# Patient Record
Sex: Female | Born: 1985 | Race: Black or African American | Hispanic: No | Marital: Single | Smoking: Former smoker
Health system: Southern US, Community
[De-identification: ages and names within clinical notes are randomized; demographics above are authoritative.]

## PROBLEM LIST (undated history)

## (undated) ENCOUNTER — Inpatient Hospital Stay (HOSPITAL_COMMUNITY): Payer: Self-pay

## (undated) DIAGNOSIS — I10 Essential (primary) hypertension: Secondary | ICD-10-CM

## (undated) DIAGNOSIS — F329 Major depressive disorder, single episode, unspecified: Secondary | ICD-10-CM

## (undated) DIAGNOSIS — G43909 Migraine, unspecified, not intractable, without status migrainosus: Secondary | ICD-10-CM

## (undated) DIAGNOSIS — D649 Anemia, unspecified: Secondary | ICD-10-CM

## (undated) DIAGNOSIS — E669 Obesity, unspecified: Secondary | ICD-10-CM

## (undated) DIAGNOSIS — R87619 Unspecified abnormal cytological findings in specimens from cervix uteri: Secondary | ICD-10-CM

## (undated) DIAGNOSIS — F32A Depression, unspecified: Secondary | ICD-10-CM

## (undated) DIAGNOSIS — B999 Unspecified infectious disease: Secondary | ICD-10-CM

## (undated) DIAGNOSIS — IMO0002 Reserved for concepts with insufficient information to code with codable children: Secondary | ICD-10-CM

## (undated) DIAGNOSIS — D219 Benign neoplasm of connective and other soft tissue, unspecified: Secondary | ICD-10-CM

## (undated) HISTORY — DX: Reserved for concepts with insufficient information to code with codable children: IMO0002

## (undated) HISTORY — DX: Unspecified infectious disease: B99.9

## (undated) HISTORY — DX: Anemia, unspecified: D64.9

## (undated) HISTORY — PX: BREAST SURGERY: SHX581

## (undated) HISTORY — DX: Unspecified abnormal cytological findings in specimens from cervix uteri: R87.619

---

## 2010-01-19 ENCOUNTER — Emergency Department (HOSPITAL_COMMUNITY): Admission: EM | Admit: 2010-01-19 | Discharge: 2010-01-19 | Payer: Self-pay | Admitting: Emergency Medicine

## 2012-07-17 ENCOUNTER — Emergency Department (HOSPITAL_COMMUNITY): Payer: Self-pay

## 2012-07-17 ENCOUNTER — Encounter (HOSPITAL_COMMUNITY): Payer: Self-pay | Admitting: *Deleted

## 2012-07-17 ENCOUNTER — Emergency Department (HOSPITAL_COMMUNITY)
Admission: EM | Admit: 2012-07-17 | Discharge: 2012-07-17 | Disposition: A | Discharge status: Home / Self Care | Payer: Self-pay | Attending: Emergency Medicine | Admitting: Emergency Medicine

## 2012-07-17 DIAGNOSIS — F17200 Nicotine dependence, unspecified, uncomplicated: Secondary | ICD-10-CM | POA: Insufficient documentation

## 2012-07-17 DIAGNOSIS — G43909 Migraine, unspecified, not intractable, without status migrainosus: Secondary | ICD-10-CM | POA: Insufficient documentation

## 2012-07-17 HISTORY — DX: Migraine, unspecified, not intractable, without status migrainosus: G43.909

## 2012-07-17 MED ORDER — KETOROLAC TROMETHAMINE 60 MG/2ML IM SOLN
60.0000 mg | Freq: Once | INTRAMUSCULAR | Status: AC
  Administered 2012-07-17: 60 mg via INTRAMUSCULAR
  Filled 2012-07-17: qty 2

## 2012-07-17 MED ORDER — SUMATRIPTAN SUCCINATE 25 MG PO TABS: 25.0000 mg | ORAL_TABLET | ORAL | Status: DC | PRN

## 2012-07-17 MED ORDER — TRAMADOL HCL 50 MG PO TABS: 50.0000 mg | ORAL_TABLET | Freq: Four times a day (QID) | ORAL | Status: AC | PRN

## 2012-07-17 MED ORDER — DEXAMETHASONE SODIUM PHOSPHATE 10 MG/ML IJ SOLN
10.0000 mg | Freq: Once | INTRAMUSCULAR | Status: AC
  Administered 2012-07-17: 10 mg via INTRAMUSCULAR
  Filled 2012-07-17: qty 1

## 2012-07-17 MED ORDER — METHOCARBAMOL 500 MG PO TABS
500.0000 mg | ORAL_TABLET | Freq: Once | ORAL | Status: AC
  Administered 2012-07-17: 500 mg via ORAL
  Filled 2012-07-17: qty 1

## 2012-07-17 NOTE — ED Provider Notes (Signed)
History     CSN: [id number]  Arrival date & time 07/17/12  1057   First MD Initiated Contact with Patient 07/17/12 1158      12:43 PM HPI Pt reports h/o migraine for 2 years. Reports they are becoming more severe. States migraines are located behind her right eye. Describes them as throbbing. Reports she became concerned because the migraine was so severe it seemed to be causing blurry vision. However denies actual eye pain. Pt states she has never had eval for migraines. No improvement with OTC meds  Patient is a 26 y.o. female presenting with migraine. The history is provided by the patient.  Migraine This is a chronic problem. The current episode started 1 to 4 weeks ago. The problem occurs constantly. The problem has been unchanged. Associated symptoms include headaches, nausea and a visual change. Pertinent negatives include no chest pain, chills, congestion, coughing, fatigue, fever, neck pain, numbness, sore throat, vertigo, vomiting or weakness. Exacerbated by: bright light. She has tried nothing for the symptoms. The treatment provided no relief.    Past Medical History  Diagnosis Date  . Migraine     History reviewed. No pertinent past surgical history.  No family history on file.  History  Substance Use Topics  . Smoking status: Current Everyday Smoker  . Smokeless tobacco: Not on file  . Alcohol Use: Yes     occasionally    OB History    Grav Para Term Preterm Abortions TAB SAB Ect Mult Living                  Review of Systems  Constitutional: Negative for fever, chills and fatigue.  HENT: Negative for congestion, sore throat, rhinorrhea, trouble swallowing, neck pain, neck stiffness, postnasal drip and sinus pressure.   Respiratory: Negative for cough and shortness of breath.   Cardiovascular: Negative for chest pain and palpitations.  Gastrointestinal: Positive for nausea. Negative for vomiting.  Musculoskeletal: Negative for back pain.  Neurological:  Positive for headaches. Negative for dizziness, vertigo, seizures, speech difficulty, weakness, light-headedness and numbness.  All other systems reviewed and are negative.    Allergies  Review of patient's allergies indicates no known allergies.  Home Medications  No current outpatient prescriptions on file.  BP 144/98  Pulse 60  Resp 14  SpO2 96%  LMP 07/04/2012  Physical Exam  Vitals reviewed. Constitutional: She is oriented to person, place, and time. Vital signs are normal. She appears well-developed and well-nourished. No distress.  HENT:  Head: Normocephalic and atraumatic.  Mouth/Throat: Oropharynx is clear and moist. No oropharyngeal exudate.  Eyes: Conjunctivae and EOM are normal. Pupils are equal, round, and reactive to light. Right eye exhibits no discharge. Left eye exhibits no discharge.  Neck: Neck supple. No spinous process tenderness and no muscular tenderness present. No rigidity. Normal range of motion present.  Pulmonary/Chest: Effort normal.  Lymphadenopathy:    She has no cervical adenopathy.  Neurological: She is alert and oriented to person, place, and time. No cranial nerve deficit ( tested CN III-XII). She exhibits normal muscle tone (nml handgrip strength). Coordination (no nystagmus, pastpointing or pronator drift) normal.  Skin: Skin is warm and dry. No rash noted. No erythema. No pallor.  Psychiatric: She has a normal mood and affect. Her behavior is normal.    ED Course  Procedures   No results found for this or any previous visit. Ct Head Wo Contrast  07/17/2012  *RADIOLOGY REPORT*  Clinical Data:  Migraine headaches for  1 month.  CT HEAD WITHOUT CONTRAST  Technique:  Contiguous axial images were obtained from the base of the skull through the vertex without contrast  Comparison:  None.  Findings:  The brain has a normal appearance without evidence for hemorrhage, acute infarction, hydrocephalus, or mass lesion.  There is no extra axial fluid  collection.  The skull and paranasal sinuses are normal.  IMPRESSION: Normal CT of the head without contrast.  Original Report Authenticated By: Elsie Stain, M.D.     MDM   CT negative. Provided with headache [facility] contact info Will d/c with ultram and sumatriptan     Thomasene Lot, PA-C 07/17/12 1333

## 2012-07-17 NOTE — ED Provider Notes (Signed)
Medical screening examination/treatment/procedure(s) were performed by non-physician practitioner and as supervising physician I was immediately available for consultation/collaboration. Devoria Albe, MD, FACEP   Ward Givens, MD 07/17/12 [phone]

## 2012-07-17 NOTE — ED Notes (Signed)
Pt reports migraine x43month. R sided. Hx of same. Light and sound sensitivity. Has tried OTC medications and percocet without relief. +nausea, denies vomiting.

## 2012-07-18 ENCOUNTER — Emergency Department (HOSPITAL_COMMUNITY)
Admission: EM | Admit: 2012-07-18 | Discharge: 2012-07-18 | Disposition: A | Discharge status: Home / Self Care | Payer: Self-pay | Attending: Emergency Medicine | Admitting: Emergency Medicine

## 2012-07-18 ENCOUNTER — Encounter (HOSPITAL_COMMUNITY): Payer: Self-pay | Admitting: Emergency Medicine

## 2012-07-18 DIAGNOSIS — R51 Headache: Secondary | ICD-10-CM

## 2012-07-18 DIAGNOSIS — G43909 Migraine, unspecified, not intractable, without status migrainosus: Secondary | ICD-10-CM | POA: Insufficient documentation

## 2012-07-18 DIAGNOSIS — F17200 Nicotine dependence, unspecified, uncomplicated: Secondary | ICD-10-CM | POA: Insufficient documentation

## 2012-07-18 MED ORDER — SUMATRIPTAN SUCCINATE 25 MG PO TABS: 25.0000 mg | ORAL_TABLET | ORAL | Status: DC | PRN

## 2012-07-18 MED ORDER — KETOROLAC TROMETHAMINE 30 MG/ML IJ SOLN
30.0000 mg | Freq: Once | INTRAMUSCULAR | Status: AC
  Administered 2012-07-18: 30 mg via INTRAMUSCULAR
  Filled 2012-07-18: qty 1

## 2012-07-18 NOTE — ED Notes (Signed)
Pt states she was seen for migraine headache yesterday and was prescribed Imitrex and Tramadol. Pt states she is unable to fill Rx's because she is paying out of pocket. Pt states she is still in pain. Pt denies nausea, but c/o light sensitivity.

## 2012-07-18 NOTE — ED Provider Notes (Signed)
History     CSN: [id number]  Arrival date & time 07/18/12  1611   First MD Initiated Contact with Patient 07/18/12 1636      No chief complaint on file.   (Consider location/radiation/quality/duration/timing/severity/associated sxs/prior treatment) Patient is a 26 y.o. female presenting with migraine. The history is provided by the patient.  Migraine This is a chronic problem. Episode onset: Headaches for the last 2 years. Current episode for the last few weeks. The problem occurs constantly. Progression since onset: Improved yesterday after treatment in the emergency department, returned today. Associated symptoms include headaches. Pertinent negatives include no congestion, joint swelling, numbness, rash, sore throat, vomiting or weakness. Associated symptoms comments: Positive photophobia. Negative fever, chills, neck stiffness. Negative visual change. Nausea in the past but none today.. Exacerbated by: Bright lights.   no treatment tried at home. Treated in the emergency department yesterday with Decadron, Robaxin, Toradol with good temporary relief of symptoms. Returns to the year today as a prescription she was given her point cost several $100 at both of the outpatient pharmacies she checked with.  Past Medical History  Diagnosis Date  . Migraine     History reviewed. No pertinent past surgical history.  No family history on file.  History  Substance Use Topics  . Smoking status: Current Everyday Smoker  . Smokeless tobacco: Not on file  . Alcohol Use: Yes     occasionally     Review of Systems  HENT: Negative for congestion and sore throat.   Gastrointestinal: Negative for vomiting.  Musculoskeletal: Negative for joint swelling.  Skin: Negative for rash.  Neurological: Positive for headaches. Negative for weakness and numbness.  10 systems reviewed and are negative for acute change except as noted in the HPI.]   Allergies  Review of patient's allergies indicates  no known allergies.  Home Medications   Current Outpatient Rx  Name Route Sig Dispense Refill  . SUMATRIPTAN SUCCINATE 25 MG PO TABS Oral Take 1 tablet (25 mg total) by mouth every 2 (two) hours as needed for migraine. 20 tablet 0  . TRAMADOL HCL 50 MG PO TABS Oral Take 1 tablet (50 mg total) by mouth every 6 (six) hours as needed for pain. 30 tablet 0    BP 133/97  Pulse 66  Temp 99.1 F (37.3 C) (Oral)  SpO2 99%  LMP 07/04/2012  Physical Exam  Nursing note and vitals reviewed.  patient is well-developed, well-nourished, mildly uncomfortable appearing. Head is normocephalic and atraumatic. External ears are normal. Oral mucosa moist. Pupils are equal, round, reactive to light. Conjunctiva are normal. Extraocular movements are intact without nystagmus. Neck is supple without adenopathy. Heart regular rate and rhythm. Normal respiratory effort and excursion. Extremities without edema. She is alert and oriented x3. Cranial nerves III through XII are tested and are intact. Bilateral grip strength 5 out of 5. Finger to nose intact bilaterally. No pronator drift. Gait is normal. Romberg negative. Skin is warm and dry without any obvious rash or lesion. Mood and affect are normal.  ED Course  Procedures (including critical care time)  Labs Reviewed - No data to display Ct Head Wo Contrast  07/17/2012  *RADIOLOGY REPORT*  Clinical Data:  Migraine headaches for 1 month.  CT HEAD WITHOUT CONTRAST  Technique:  Contiguous axial images were obtained from the base of the skull through the vertex without contrast  Comparison:  None.  Findings:  The brain has a normal appearance without evidence for hemorrhage, acute infarction, hydrocephalus, or  mass lesion.  There is no extra axial fluid collection.  The skull and paranasal sinuses are normal.  IMPRESSION: Normal CT of the head without contrast.  Original Report Authenticated By: Elsie Stain, M.D.     Dx 1: Headache   MDM  Migraine  headache in a patient with a known history of same. She was seen in the emergency department yesterday and had a CT scan which was normal. She does not have any signs of meningitis such as fever, neck stiffness, rash. There's been no head injury since her prior evaluation. Not sudden onset or worse headache of life to suggest subarachnoid hemorrhage the may not have been seen on CT scan. I called the [place] outpatient pharmacy and they advised that Imitrex will cost $90 when written as a prescription to the emergency department. As the patient turned in her initial prescription to [facility], our write her a new one. She is given a repeat dose of Toradol (half the prior dose to avoid excess strain on the kidneys) and will be discharged home.        Shaaron Adler, [place] 07/18/12 1704

## 2012-07-18 NOTE — ED Provider Notes (Signed)
Medical screening examination/treatment/procedure(s) were performed by non-physician practitioner and as supervising physician I was immediately available for consultation/collaboration.  Mariadelcarmen Corella, MD 07/18/12 1707 

## 2012-11-08 ENCOUNTER — Ambulatory Visit (INDEPENDENT_AMBULATORY_CARE_PROVIDER_SITE_OTHER): Payer: BC Managed Care – PPO | Admitting: Obstetrics and Gynecology

## 2012-11-08 VITALS — BP 130/80

## 2012-11-08 DIAGNOSIS — Z331 Pregnant state, incidental: Secondary | ICD-10-CM

## 2012-11-08 LAB — POCT URINALYSIS DIPSTICK
Bilirubin, UA: NEGATIVE
Glucose, UA: NEGATIVE
Ketones, UA: NEGATIVE
Protein, UA: NEGATIVE

## 2012-11-08 MED ORDER — PROMETHAZINE HCL 25 MG PO TABS: 25.0000 mg | ORAL_TABLET | Freq: Four times a day (QID) | ORAL | Status: DC | PRN

## 2012-11-08 NOTE — Progress Notes (Signed)
NOB interview completed.  Pt came in c/o severe headaches for a while now.  Has been going to bed with a headache and waking up with a headache.  Was Rx'd Fiorcet by [facility] @ [place] [employer], pt says it is not helping, is taking as Rx'd.  Was seen @ [facility] in 06/2012 for migraines (pt has a hx), had a normal CT scan.  Per VL, get a BP on pt, was 130/80.  Pt advised to continue meds, push fluids, rest, also made an appt w/ AVS for Monday 11/13/12 @ 1115 for eval.  Pt advised to call anytime she has any concerns.  Pt scheduled for an appt for NOB work up on 12/11/12 @ 1100 w/ SL.

## 2012-11-09 LAB — PRENATAL PANEL VII
Antibody Screen: NEGATIVE
Basophils Relative: 0 % (ref 0–1)
Eosinophils Absolute: 0.1 10*3/uL (ref 0.0–0.7)
Eosinophils Relative: 2 % (ref 0–5)
Hemoglobin: 12.7 g/dL (ref 12.0–15.0)
Lymphs Abs: 2 10*3/uL (ref 0.7–4.0)
MCH: 32.2 pg (ref 26.0–34.0)
MCHC: 34 g/dL (ref 30.0–36.0)
MCV: 94.9 fL (ref 78.0–100.0)
Monocytes Relative: 8 % (ref 3–12)
RBC: 3.94 MIL/uL (ref 3.87–5.11)
Rh Type: POSITIVE
Rubella: 217.8 IU/mL — ABNORMAL HIGH

## 2012-11-10 LAB — HEMOGLOBINOPATHY EVALUATION: Hgb A: 97.4 % (ref 96.8–97.8)

## 2012-11-13 ENCOUNTER — Encounter: Payer: Self-pay | Admitting: Obstetrics and Gynecology

## 2012-11-13 ENCOUNTER — Ambulatory Visit (INDEPENDENT_AMBULATORY_CARE_PROVIDER_SITE_OTHER): Payer: BC Managed Care – PPO | Admitting: Obstetrics and Gynecology

## 2012-11-13 VITALS — BP 110/62 | Wt 229.0 lb

## 2012-11-13 DIAGNOSIS — R51 Headache: Secondary | ICD-10-CM

## 2012-11-13 DIAGNOSIS — R110 Nausea: Secondary | ICD-10-CM

## 2012-11-13 MED ORDER — HYDROCODONE-ACETAMINOPHEN 5-500 MG PO TABS: 1.0000 | ORAL_TABLET | ORAL | Status: DC | PRN

## 2012-11-13 MED ORDER — ONDANSETRON HCL 4 MG PO TABS: 4.0000 mg | ORAL_TABLET | Freq: Three times a day (TID) | ORAL | Status: DC | PRN

## 2012-11-13 NOTE — Progress Notes (Signed)
HISTORY OF PRESENT ILLNESS  [name] is a 26 y.o. year old female,G2P1001, who presents for a problem visit. The patient is currently 6 weeks and 2 days pregnant.  She has a history of migraine headaches.  She has been treated with Fioricet.  This has been of little benefit.  Subjective:  The patient complains of nausea as well as recurrent headaches.  Objective:  BP 110/62  Wt 229 lb (103.874 kg)  LMP 09/30/2012   General: mild distress  HEENT: Light and sound seem to be bothersome  Exam deferred.  Assessment:  6 week and 2 day gestation  Worsening migraine headaches  Nausea of pregnancy  Plan:  We will refer the patient to a neurologist to manage her headaches.  Vicodin 1 or 2 tablets every 4 hours as needed for headache.  Zofran 4 mg every 4-6 hours as needed for nausea.  Return for new OB examination.  Leonard Schwartz M.D.  11/13/2012 7:21 PM

## 2012-11-17 ENCOUNTER — Telehealth: Payer: Self-pay

## 2012-11-17 NOTE — Telephone Encounter (Signed)
Per AVS pt needs referral to see Neurologist to manage headaches. Phone call to [facility] regarding making pt an appt  They asked that a referral be faxed and they will then contact the patient Office note was printed and faxed to Attention Diane the New Patient Coordinator. Pt was called to be made aware but she has no voice mail set up.   [facility] CMA

## 2012-11-29 ENCOUNTER — Encounter: Payer: Self-pay | Admitting: Obstetrics and Gynecology

## 2012-12-04 ENCOUNTER — Telehealth: Payer: Self-pay | Admitting: Obstetrics and Gynecology

## 2012-12-04 NOTE — Telephone Encounter (Signed)
Tc to pt regarding msg.  Pt says she has bumps all over her body and they are itching badly.  Pt is 9w 2d, did not know what to do.  Pt advised to try taking Benadryl to see if it will help relieve the itching and clear up the bumps.  If not to go to an urgent care to see if they can help, if they feel she needs to be seen by our office, pt advised to call office for an appt, pt voices agreement.

## 2012-12-05 ENCOUNTER — Encounter (HOSPITAL_COMMUNITY): Payer: Self-pay | Admitting: Obstetrics and Gynecology

## 2012-12-05 ENCOUNTER — Emergency Department (HOSPITAL_COMMUNITY)
Admission: EM | Admit: 2012-12-05 | Discharge: 2012-12-05 | Disposition: A | Discharge status: Home / Self Care | Payer: Self-pay

## 2012-12-05 ENCOUNTER — Inpatient Hospital Stay (HOSPITAL_COMMUNITY)
Admission: AD | Admit: 2012-12-05 | Discharge: 2012-12-05 | Disposition: A | Discharge status: Home / Self Care | Payer: Medicaid Other | Source: Ambulatory Visit | Attending: Obstetrics and Gynecology | Admitting: Obstetrics and Gynecology

## 2012-12-05 DIAGNOSIS — R5381 Other malaise: Secondary | ICD-10-CM | POA: Insufficient documentation

## 2012-12-05 DIAGNOSIS — O210 Mild hyperemesis gravidarum: Secondary | ICD-10-CM | POA: Insufficient documentation

## 2012-12-05 DIAGNOSIS — Z0289 Encounter for other administrative examinations: Secondary | ICD-10-CM | POA: Insufficient documentation

## 2012-12-05 DIAGNOSIS — G43909 Migraine, unspecified, not intractable, without status migrainosus: Secondary | ICD-10-CM | POA: Insufficient documentation

## 2012-12-05 DIAGNOSIS — O99891 Other specified diseases and conditions complicating pregnancy: Secondary | ICD-10-CM | POA: Insufficient documentation

## 2012-12-05 DIAGNOSIS — Z331 Pregnant state, incidental: Secondary | ICD-10-CM

## 2012-12-05 DIAGNOSIS — R51 Headache: Secondary | ICD-10-CM

## 2012-12-05 LAB — URINALYSIS, ROUTINE W REFLEX MICROSCOPIC
Ketones, ur: NEGATIVE mg/dL
Leukocytes, UA: NEGATIVE
Nitrite: NEGATIVE
pH: 7 (ref 5.0–8.0)

## 2012-12-05 MED ORDER — DEXAMETHASONE SODIUM PHOSPHATE 10 MG/ML IJ SOLN
10.0000 mg | Freq: Once | INTRAMUSCULAR | Status: AC
  Administered 2012-12-05: 10 mg via INTRAVENOUS
  Filled 2012-12-05: qty 1

## 2012-12-05 MED ORDER — BUTALBITAL-APAP-CAFFEINE 50-325-40 MG PO TABS: 1.0000 | ORAL_TABLET | ORAL | Status: DC | PRN

## 2012-12-05 MED ORDER — DIPHENHYDRAMINE HCL 50 MG/ML IJ SOLN
25.0000 mg | Freq: Once | INTRAMUSCULAR | Status: AC
  Administered 2012-12-05: 25 mg via INTRAVENOUS
  Filled 2012-12-05: qty 1

## 2012-12-05 MED ORDER — PROMETHAZINE HCL 25 MG/ML IJ SOLN
12.5000 mg | Freq: Once | INTRAMUSCULAR | Status: AC
  Administered 2012-12-05: 12.5 mg via INTRAVENOUS
  Filled 2012-12-05: qty 1

## 2012-12-05 MED ORDER — LACTATED RINGERS IV BOLUS (SEPSIS)
500.0000 mL | Freq: Once | INTRAVENOUS | Status: AC
  Administered 2012-12-05: 500 mL via INTRAVENOUS

## 2012-12-05 MED ORDER — SUMATRIPTAN SUCCINATE 25 MG PO TABS: 25.0000 mg | ORAL_TABLET | ORAL | Status: DC | PRN

## 2012-12-05 MED ORDER — PROCHLORPERAZINE EDISYLATE 5 MG/ML IJ SOLN
10.0000 mg | Freq: Once | INTRAMUSCULAR | Status: AC
  Administered 2012-12-05: 10 mg via INTRAVENOUS
  Filled 2012-12-05: qty 2

## 2012-12-05 MED ORDER — PROMETHAZINE HCL 25 MG PO TABS: 25.0000 mg | ORAL_TABLET | Freq: Four times a day (QID) | ORAL | Status: DC | PRN

## 2012-12-05 NOTE — MAU Note (Signed)
Pt presents with complaint of "my head is hurting, i feel like something in my head is going to bust", states she has had the headache for 6 months.

## 2012-12-05 NOTE — MAU Provider Note (Signed)
History     CSN: [id number]  Arrival date and time: 12/05/12 1922   None     Chief Complaint  Patient presents with  . Headache   HPI Comments: Pt is 26yo G2P1 at [redacted]w[redacted]d w c/o persistent HA, vomiting, generalized weakness. Has hx of migraines "ran out of medicines" Has not taken hyperemetics, that were previously prescribed. "cant drink water" Feels nauseous all day, vomiting when waking up, occ throughout day. Went to [facility] ED today, but wasn't seen by physicians, states "wait was too long"  Saw Dr Stefano Gaul last week for HA's Scheduled for NOB w/u next week.     Past Medical History  Diagnosis Date  . Migraine     Imitrex taken in the past  . Abnormal Pap smear 2008;2013    Rpt  paps have been abnl;Last papn 2012 was abnormal  . Anemia     Iron supplements taken in the past;during last pregnancy  . Infection     BV x 3;currently being treated for infection  . Infection     Yeast;not frequent    Past Surgical History  Procedure Date  . 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 Hypertension Mother   . Diabetes Maternal Grandfather   . Hypertension Maternal Grandfather   . Asthma Sister   . Migraines Mother   . Migraines Sister   . Migraines Brother   . Migraines Maternal Uncle   . Migraines Maternal Grandmother   . Other Maternal Grandmother     Varicose veins    History  Substance Use Topics  . Smoking status: Never Smoker   . Smokeless tobacco: Never Used  . Alcohol Use: Yes     Comment: occasionally prior to pregnancy    Allergies: No Known Allergies  Prescriptions prior to admission  Medication Sig Dispense Refill  . acetaminophen (TYLENOL) 500 MG tablet Take 500 mg by mouth every 6 (six) hours as needed. For headache      . butalbital-acetaminophen-caffeine (FIORICET, ESGIC) 50-325-40 MG per tablet Take 1 tablet by mouth every 4 (four) hours as needed.      . flintstones complete  (FLINTSTONES) 60 MG chewable tablet Chew 1 tablet by mouth daily.      Marland Kitchen HYDROcodone-acetaminophen (VICODIN) 5-500 MG per tablet Take 1-2 tablets by mouth every 4 (four) hours as needed for pain.  30 tablet  0  . metroNIDAZOLE (FLAGYL) 500 MG tablet Take 500 mg by mouth daily.      . Prenatal Vit-Fe Fumarate-FA (PRENATAL MULTIVITAMIN) TABS Take 1 tablet by mouth daily.      . promethazine (PHENERGAN) 25 MG tablet Take 1 tablet (25 mg total) by mouth every 6 (six) hours as needed for nausea.  36 tablet  2  . SUMAtriptan (IMITREX) 25 MG tablet Take 1 tablet (25 mg total) by mouth every 2 (two) hours as needed for migraine. Do not take more than 200mg  in 1 day.  20 tablet  0    Review of Systems  HENT: Negative for neck pain.   Eyes: Positive for photophobia.  Cardiovascular: Negative for chest pain and palpitations.  Gastrointestinal: Positive for nausea and vomiting. Negative for heartburn, abdominal pain, diarrhea and constipation.  Genitourinary: Negative for dysuria, urgency and frequency.  Neurological: Positive for weakness and headaches. Negative for loss of consciousness.  All other systems reviewed and are negative.   Physical Exam  Blood pressure 130/81, pulse 91, temperature 98.8 F (37.1 C), temperature source Oral, resp. rate 16, height 5' 6.5" (1.689 m), weight 229 lb (103.874 kg), last menstrual period 09/30/2012, SpO2 97.00%.  Physical Exam  Nursing note and vitals reviewed. Constitutional: She is oriented to person, place, and time. She appears well-developed and well-nourished.       Lights off in room, holding head, pt very distracted, A&O x3,   HENT:  Head: Normocephalic.  Eyes: Pupils are equal, round, and reactive to light.  Neck: Normal range of motion.       No stiffness  Cardiovascular: Normal rate, regular rhythm and normal heart sounds.   Respiratory: Effort normal and breath sounds normal.  GI: Soft. Bowel sounds are normal.  Genitourinary:        deferred  Musculoskeletal: Normal range of motion. She exhibits no edema and no tenderness.  Neurological: She is alert and oriented to person, place, and time. She has normal reflexes.  Skin: Skin is warm and dry.  Psychiatric: She has a normal mood and affect. Her behavior is normal.   Results for orders placed during the hospital encounter of 12/05/12 (from the past 24 hour(s))  URINALYSIS, ROUTINE W REFLEX MICROSCOPIC     Status: Normal   Collection Time   12/05/12  7:31 PM      Component Value Range   Color, Urine YELLOW  YELLOW   APPearance CLEAR  CLEAR   Specific Gravity, Urine 1.015  1.005 - 1.030   pH 7.0  5.0 - 8.0   Glucose, UA NEGATIVE  NEGATIVE mg/dL   Hgb urine dipstick NEGATIVE  NEGATIVE   Bilirubin Urine NEGATIVE  NEGATIVE   Ketones, ur NEGATIVE  NEGATIVE mg/dL   Protein, ur NEGATIVE  NEGATIVE mg/dL   Urobilinogen, UA 0.2  0.0 - 1.0 mg/dL   Nitrite NEGATIVE  NEGATIVE   Leukocytes, UA NEGATIVE  NEGATIVE     MAU Course  Procedures    Assessment and Plan  IUP at [redacted]w[redacted]d by LMP Hx persistent migraines - scheduled to see neurology 12-12, non-compliant w medications  UA normal  Normal 1st trimester symptoms  IVF's LR bolus HA cocktail - decadron 10mg , compazine 10mg , benadryl 25mg     Nashawn Hillock M 12/05/2012, 8:52 PM   Addendum: 2300  Pt reports relief of HA and nausea Ready for d/c rx for: fiorcet, imatrex, phenergan Keep scheduled appointment Hyperemesis diet and BRAT diet Counseled re: hydration, taking rx'd medicine regimen rv'd w Dr Ward Givens, CNM

## 2012-12-11 ENCOUNTER — Encounter: Payer: Self-pay | Admitting: Obstetrics and Gynecology

## 2012-12-12 ENCOUNTER — Telehealth (HOSPITAL_COMMUNITY): Payer: Self-pay | Admitting: Obstetrics and Gynecology

## 2012-12-12 NOTE — Telephone Encounter (Signed)
TC from patient--10 weeks, NOB w/u had to be R/S from yesterday due to CNM absence. Patient thinks she has BV and wants to be seen ASAP. Office called, LM to call patient this am to schedule w/i appt today for evaluation. Apologies made.

## 2012-12-13 ENCOUNTER — Encounter: Payer: Self-pay | Admitting: Obstetrics and Gynecology

## 2012-12-13 ENCOUNTER — Telehealth: Payer: Self-pay | Admitting: Obstetrics and Gynecology

## 2012-12-13 ENCOUNTER — Ambulatory Visit (INDEPENDENT_AMBULATORY_CARE_PROVIDER_SITE_OTHER): Payer: Medicaid Other | Admitting: Obstetrics and Gynecology

## 2012-12-13 VITALS — BP 100/66 | Wt 232.0 lb

## 2012-12-13 DIAGNOSIS — Z124 Encounter for screening for malignant neoplasm of cervix: Secondary | ICD-10-CM

## 2012-12-13 DIAGNOSIS — Z331 Pregnant state, incidental: Secondary | ICD-10-CM

## 2012-12-13 DIAGNOSIS — N898 Other specified noninflammatory disorders of vagina: Secondary | ICD-10-CM

## 2012-12-13 DIAGNOSIS — Z3490 Encounter for supervision of normal pregnancy, unspecified, unspecified trimester: Secondary | ICD-10-CM

## 2012-12-13 LAB — POCT WET PREP (WET MOUNT): pH: 5.5

## 2012-12-13 LAB — POCT OSOM BVBLUE TEST: Bacterial Vaginosis: POSITIVE

## 2012-12-13 MED ORDER — CONCEPT OB 130-92.4-1 MG PO CAPS: 1.0000 | ORAL_CAPSULE | Freq: Every day | ORAL | Status: DC

## 2012-12-13 MED ORDER — METRONIDAZOLE 0.75 % EX GEL: CUTANEOUS | Status: DC

## 2012-12-13 NOTE — Progress Notes (Signed)
  Subjective:    Sarah Oconnor is being seen today for her first obstetrical visit.  This is not a planned pregnancy. She is at [redacted]w[redacted]d gestation. Her obstetrical history is significant for daily headaches. Relationship with FOB: present. . Pregnancy history fully reviewed.  Patient reports daily headaches with some relief from Fioricet.  She has been evaluated with narcotics prescribed.  She has some nausea.   Review of Systems:   Review of Systems  Constitutional: Negative.   HENT: Positive for neck stiffness.   Eyes: Positive for visual disturbance.       Only  with headaches  Cardiovascular: Negative.   Gastrointestinal: Positive for nausea.  Genitourinary:        occasional crampy abdominal pain Patient did not take medication forBV  Skin: Negative.   Neurological: Positive for headaches.  Hematological: Negative.   Psychiatric/Behavioral: Negative.     Objective:     BP 100/66  Wt 232 lb (105.235 kg)  LMP 09/30/2012 Physical Exam  Constitutional: She is oriented to person, place, and time. She appears well-developed and well-nourished.  HENT:  Head: Normocephalic and atraumatic.  Mouth/Throat: Oropharynx is clear and moist.  Eyes: Conjunctivae normal and EOM are normal. Pupils are equal, round, and reactive to light.  Neck: Normal range of motion. Neck supple.  Cardiovascular: Normal rate, regular rhythm, normal heart sounds and intact distal pulses.   Respiratory: Effort normal and breath sounds normal.  GI: Soft. Bowel sounds are normal.  Genitourinary:       Wallace Cullens vaginal discharge  Musculoskeletal: Normal range of motion.  Neurological: She is alert and oriented to person, place, and time.  Skin: Skin is warm and dry.  Psychiatric: She has a normal mood and affect.    Exam   OSCOM BV: POSITIVE WET PREP:  BV Assessment:    Pregnancy: G2P1001 Patient Active Problem List  Diagnosis  . Pregnancy  Migraine headaches vs muscle tension headaches BV     Plan:     Ibuproben 6oomg q 6 h for 5 days Initial labs REVIEWED Prenatal vitamins. Problem list reviewed and updated. AFP3 discussed: undecided. Role of ultrasound in pregnancy discussed;. metrogel  Follow up in 4 weeks. 75% of 30 min visit spent on counseling and coordination of care.  Pt will continue followup with her chiropractor for headache relief, but will be referred to neurlogist if no improvement at next visit  Brilyn Tuller P 12/13/2012

## 2012-12-13 NOTE — Progress Notes (Deleted)
[redacted]w[redacted]d Pt c/o cramping for the last week and some spotting yesterday. Pt is unsure when her last pap was.

## 2012-12-14 LAB — PAP IG W/ RFLX HPV ASCU

## 2012-12-15 LAB — HUMAN PAPILLOMAVIRUS, HIGH RISK: HPV DNA High Risk: NOT DETECTED

## 2012-12-18 ENCOUNTER — Telehealth: Payer: Self-pay | Admitting: Obstetrics and Gynecology

## 2012-12-18 ENCOUNTER — Other Ambulatory Visit: Payer: Self-pay

## 2012-12-18 MED ORDER — RIBOFLAVIN 100 MG PO CAPS: 1.0000 | ORAL_CAPSULE | Freq: Two times a day (BID) | ORAL | Status: DC

## 2012-12-18 MED ORDER — MAGNESIUM OXIDE 400 MG PO TABS: 400.0000 mg | ORAL_TABLET | Freq: Two times a day (BID) | ORAL | Status: DC

## 2012-12-18 MED ORDER — OXYCODONE-ACETAMINOPHEN 5-325 MG PO TABS: ORAL_TABLET | ORAL | Status: DC

## 2012-12-18 NOTE — Telephone Encounter (Signed)
12/18/12 addendum Spoke with Donna(Dr. Zannie Cove nurse). Told rx for Endocet was faxed to [facility]([place]) on 12/07/12. Pc to [facility] and rx was never received for Endocet. Endocet given to pt today per Dr.Yan's directions.

## 2012-12-18 NOTE — Telephone Encounter (Signed)
Pt instructed to take Riboflavin and Magnesium as directed daily to help with prevention of migraines. If migraine occur, pt to then try Endocet or Promethazine per neurology recs. Pt voices understanding.

## 2012-12-18 NOTE — Telephone Encounter (Signed)
Tc to pt per telephone call. Pt c/o persistent migraines. Pt has tried Ibuprofen and Fioricet without improvement. Pt states,"unable to sched her appt with her chiropractor x approx 2 weeks". Will consult with [facility] per recs and cb. Pt agrees.

## 2012-12-19 ENCOUNTER — Encounter: Payer: Self-pay | Admitting: Obstetrics and Gynecology

## 2012-12-27 NOTE — L&D Delivery Note (Addendum)
Delivery Note At  1:15p, a non-viable fetus, gender difficult to tell due to early gestation, was delivered via vtx.  APGAR: 0/0 , ; weight .   Placenta delivered at 3:45--no significant bleeding after fetal delivery.  Pitocin was begun post fetal delivery per standard pp dosing, and placenta was delivered spontaneously.  Bleeding minimal after placental delivery.  Cord:  with the following complications: None  Cord pH: NA  Placenta was intact--had approx 4 cm clot adherent to edge of placenta.  Fetal surface remarkable for cystic/bulbous structures.   Sample of placenta, cord, and small volume of amniotic fluid collected and sent to [facility] for karotype of tissue and culture of fluid. Remainder of placenta sent to [facility] Pathology Dept for evaluation. Family plans autopsy.  Patient, FOB, and patient's mother viewed baby after delivery, with parents holding baby.  Comfort pictures taken. Chaplain notified of family situation.   Anesthesia:  None Episiotomy: None Lacerations: None Suture Repair: None Est. Blood Loss (mL): 200 cc  Mom to 3rd floor.Pecola Leisure to morgue.     Nigel Bridgeman 03/22/2013, 1:49 PM  Addendum: Upon pathology review of fetus, female gender was verified. Delivery note amended to reflect more clearly that gender was uncertain at the time of delivery. I communicated with Pathology Department regarding the question of gender and clarified the delivery note to reflect the gender diagnosis from pathological examination.  Nigel Bridgeman, CNM 04/12/13 8:05am

## 2013-01-09 ENCOUNTER — Encounter: Payer: BC Managed Care – PPO | Admitting: Obstetrics and Gynecology

## 2013-01-12 ENCOUNTER — Emergency Department (HOSPITAL_COMMUNITY)
Admission: EM | Admit: 2013-01-12 | Discharge: 2013-01-12 | Disposition: A | Discharge status: Home / Self Care | Payer: Medicaid Other | Attending: Emergency Medicine | Admitting: Emergency Medicine

## 2013-01-12 ENCOUNTER — Other Ambulatory Visit: Payer: Self-pay

## 2013-01-12 ENCOUNTER — Emergency Department (HOSPITAL_COMMUNITY): Payer: Medicaid Other

## 2013-01-12 ENCOUNTER — Encounter (HOSPITAL_COMMUNITY): Payer: Self-pay

## 2013-01-12 DIAGNOSIS — Z862 Personal history of diseases of the blood and blood-forming organs and certain disorders involving the immune mechanism: Secondary | ICD-10-CM | POA: Insufficient documentation

## 2013-01-12 DIAGNOSIS — Z8619 Personal history of other infectious and parasitic diseases: Secondary | ICD-10-CM | POA: Insufficient documentation

## 2013-01-12 DIAGNOSIS — N644 Mastodynia: Secondary | ICD-10-CM | POA: Insufficient documentation

## 2013-01-12 DIAGNOSIS — Z9889 Other specified postprocedural states: Secondary | ICD-10-CM | POA: Insufficient documentation

## 2013-01-12 DIAGNOSIS — R0602 Shortness of breath: Secondary | ICD-10-CM | POA: Insufficient documentation

## 2013-01-12 DIAGNOSIS — R071 Chest pain on breathing: Secondary | ICD-10-CM | POA: Insufficient documentation

## 2013-01-12 DIAGNOSIS — Z79899 Other long term (current) drug therapy: Secondary | ICD-10-CM | POA: Insufficient documentation

## 2013-01-12 DIAGNOSIS — O9989 Other specified diseases and conditions complicating pregnancy, childbirth and the puerperium: Secondary | ICD-10-CM | POA: Insufficient documentation

## 2013-01-12 DIAGNOSIS — Z8679 Personal history of other diseases of the circulatory system: Secondary | ICD-10-CM | POA: Insufficient documentation

## 2013-01-12 DIAGNOSIS — R079 Chest pain, unspecified: Secondary | ICD-10-CM

## 2013-01-12 LAB — CBC WITH DIFFERENTIAL/PLATELET
Basophils Absolute: 0 10*3/uL (ref 0.0–0.1)
Basophils Relative: 0 % (ref 0–1)
Eosinophils Absolute: 0.3 10*3/uL (ref 0.0–0.7)
Hemoglobin: 12.4 g/dL (ref 12.0–15.0)
MCH: 32.5 pg (ref 26.0–34.0)
MCHC: 34.4 g/dL (ref 30.0–36.0)
Neutro Abs: 5.5 10*3/uL (ref 1.7–7.7)
Neutrophils Relative %: 67 % (ref 43–77)
Platelets: 186 10*3/uL (ref 150–400)

## 2013-01-12 LAB — BASIC METABOLIC PANEL
BUN: 8 mg/dL (ref 6–23)
CO2: 24 mEq/L (ref 19–32)
Calcium: 9.1 mg/dL (ref 8.4–10.5)
Chloride: 101 mEq/L (ref 96–112)
Creatinine, Ser: 0.62 mg/dL (ref 0.50–1.10)
Glucose, Bld: 79 mg/dL (ref 70–99)
Potassium: 3.5 mEq/L (ref 3.5–5.1)
Sodium: 137 mEq/L (ref 135–145)

## 2013-01-12 MED ORDER — HYDROCODONE-ACETAMINOPHEN 5-325 MG PO TABS: 1.0000 | ORAL_TABLET | ORAL | Status: DC | PRN

## 2013-01-12 MED ORDER — OXYCODONE-ACETAMINOPHEN 5-325 MG PO TABS
1.0000 | ORAL_TABLET | Freq: Once | ORAL | Status: AC
  Administered 2013-01-12: 1 via ORAL
  Filled 2013-01-12: qty 1

## 2013-01-12 NOTE — ED Notes (Signed)
Pt presents with sudden onset of mid-sternal chest pain while lying on couch.  Pt describes as a sharp pain, reports shortness of breath.  Pt reports pain radiates down under breasts and around bra-line.  Pt is 4.5 months pregnant.

## 2013-01-12 NOTE — ED Notes (Signed)
Pt is 14 weeks and 5 days pregnant, seeing [facility]

## 2013-01-12 NOTE — ED Provider Notes (Signed)
History     CSN: [id number]  Arrival date & time 01/12/13  1057   First MD Initiated Contact with Patient 01/12/13 1241      No chief complaint on file.    The history is provided by the patient.   the patient reports developing sudden onset midsternal chest pain while lying on the couch.  She is 3-1/2 months pregnant.  She states when she sat for checkup a pop in her anterior chest this had sharp pain there since.  She denies unilateral leg swelling.  No history of DVT or pulmonary embolism.  She states the pain radiates down around both breasts.  She has no significant shortness of breath.  No recent long travel or surgery.  She has had an uncomplicated pregnancy to this point.  Symptoms are mild to moderate.  Her symptoms are worsened by movement and palpation.  Nothing improves her pain.  Past Medical History  Diagnosis Date  . Migraine     Imitrex taken in the past  . Abnormal Pap smear 2008;2013    Rpt  paps have been abnl;Last papn 2012 was abnormal  . Anemia     Iron supplements taken in the past;during last pregnancy  . Infection     BV x 3;currently being treated for infection  . Infection     Yeast;not frequent    Past Surgical History  Procedure Date  . 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 Hypertension Mother   . Diabetes Maternal Grandfather   . Hypertension Maternal Grandfather   . Asthma Sister   . Migraines Mother   . Migraines Sister   . Migraines Brother   . Migraines Maternal Uncle   . Migraines Maternal Grandmother   . Other Maternal Grandmother     Varicose veins    History  Substance Use Topics  . Smoking status: Never Smoker   . Smokeless tobacco: Never Used  . Alcohol Use: Yes     Comment: occasionally prior to pregnancy    OB History    Grav Para Term Preterm Abortions TAB SAB Ect Mult Living   2 1 1       1       Review of Systems  All other systems reviewed  and are negative.    Allergies  Review of patient's allergies indicates no known allergies.  Home Medications   Current Outpatient Rx  Name  Route  Sig  Dispense  Refill  . IBUPROFEN 200 MG PO TABS   Oral   Take 200 mg by mouth every 8 (eight) hours as needed. For pain         . CONCEPT OB 130-92.4-1 MG PO CAPS   Oral   Take 1 capsule by mouth daily.         Marland Kitchen PROMETHAZINE HCL 25 MG PO TABS   Oral   Take 25 mg by mouth every 6 (six) hours as needed. For nausea         . HYDROCODONE-ACETAMINOPHEN 5-325 MG PO TABS   Oral   Take 1 tablet by mouth every 4 (four) hours as needed for pain.   15 tablet   0     BP 117/85  Pulse 93  Temp 98.8 F (37.1 C) (Oral)  Resp 18  SpO2 100%  LMP 09/30/2012  Physical Exam  Nursing note and vitals reviewed. Constitutional: She is  oriented to person, place, and time. She appears well-developed and well-nourished. No distress.  HENT:  Head: Normocephalic and atraumatic.  Eyes: EOM are normal.  Neck: Normal range of motion.  Cardiovascular: Normal rate, regular rhythm and normal heart sounds.   Pulmonary/Chest: Effort normal and breath sounds normal.       Tenderness to anterior chest wall without rash.  Abdominal: Soft. She exhibits no distension. There is no tenderness.  Musculoskeletal: Normal range of motion.  Neurological: She is alert and oriented to person, place, and time.  Skin: Skin is warm and dry.  Psychiatric: She has a normal mood and affect. Judgment normal.    ED Course  Procedures (including critical care time)  Labs Reviewed  CBC WITH DIFFERENTIAL - Abnormal; Notable for the following:    RBC 3.81 (*)     All other components within normal limits  BASIC METABOLIC PANEL  POCT I-STAT TROPONIN I  D-DIMER, QUANTITATIVE   Dg Chest 2 View  01/12/2013  *RADIOLOGY REPORT*  Clinical Data: Shortness of breath.  3 months pregnant.  CHEST - 2 VIEW  Comparison: None.  Findings: The heart and mediastinal contours  are within normal limits. The lungs are well expanded and clear. No airspace disease, pleural abnormality, or pneumothorax is identified. The visualized bony thorax and upper abdomen are unremarkable.  IMPRESSION: No acute cardiopulmonary disease.   Original Report Authenticated By: Britta Mccreedy, M.D.    I personally reviewed the imaging tests through PACS system I reviewed available ER/hospitalization records through the EMR   1. Chest pain     Date: 01/12/2013  Rate: 77  Rhythm: normal sinus rhythm  QRS Axis: normal  Intervals: normal  ST/T Wave abnormalities: normal  Conduction Disutrbances: none  Narrative Interpretation:   Old EKG Reviewed: No significant changes noted      MDM  The patient's pain sounds musculoskeletal in nature.  Chest x-ray EKG and d-dimer normal.  This is not ACS.  This is not a pulmonary embolism.  His chest wall pain.  Discharge home in good condition.        Lyanne Co, MD 01/12/13 [phone]

## 2013-01-18 ENCOUNTER — Ambulatory Visit: Payer: Medicaid Other | Admitting: Obstetrics and Gynecology

## 2013-01-18 VITALS — BP 110/78 | Wt 240.0 lb

## 2013-01-18 DIAGNOSIS — Z331 Pregnant state, incidental: Secondary | ICD-10-CM

## 2013-01-18 DIAGNOSIS — R109 Unspecified abdominal pain: Secondary | ICD-10-CM

## 2013-01-18 DIAGNOSIS — Z3689 Encounter for other specified antenatal screening: Secondary | ICD-10-CM

## 2013-01-18 LAB — POCT URINALYSIS DIPSTICK
Glucose, UA: NEGATIVE
Ketones, UA: NEGATIVE
Spec Grav, UA: <=1.005

## 2013-01-18 NOTE — Progress Notes (Signed)
Patient ID: Sarah Oconnor, female   DOB: Apr 15, 1986, 27 y.o.   MRN: [id number] [redacted]w[redacted]d Quad today F/o up early 1 gtt Discussed common discomforts of pg, excessive weight gain, nutrition. Avoid sauces, gravy, sweet tea, soda, fried foods, limit eating out and watch food choices and portion sizes. No  more than 1/2 cup juice daily, better to eat fruit then drink juice.  Increase fiber in diet, fresh rather than processed foods. Get  protein throught meals and snacks to include meat, eggs,  beans, nuts skim an fat free dairy: milk, cheese, yougart, 8  glasses of water daily. Exercise discussed. Lavera Guise, CNM

## 2013-01-18 NOTE — Progress Notes (Signed)
C/o cramping advised pt to intake 8-10 glasses of water a day. Pt stated no other issues today.

## 2013-01-18 NOTE — Addendum Note (Signed)
Addended by: Tim Lair on: 01/18/2013 03:06 PM   Modules accepted: Orders

## 2013-01-22 LAB — AFP, QUAD SCREEN
AFP: 71.8 IU/mL
Curr Gest Age: 15.5 wks.days
Down Syndrome Scr Risk Est: 1:2230 {titer}
HCG, Total: 43316 m[IU]/mL
INH: 821.5 pg/mL
Interpretation-AFP: NEGATIVE
MoM for AFP: 3.04
MoM for INH: 5.37
Open Spina bifida: POSITIVE — AB
Osb Risk: 1:164 {titer}
uE3 Mom: 0.25
uE3 Value: 0.1 ng/mL

## 2013-02-08 ENCOUNTER — Encounter: Payer: Medicaid Other | Admitting: Obstetrics and Gynecology

## 2013-02-15 ENCOUNTER — Encounter: Payer: Medicaid Other | Admitting: Obstetrics and Gynecology

## 2013-02-15 ENCOUNTER — Ambulatory Visit: Payer: Medicaid Other | Admitting: Obstetrics and Gynecology

## 2013-02-15 ENCOUNTER — Encounter: Payer: Self-pay | Admitting: Obstetrics and Gynecology

## 2013-02-15 ENCOUNTER — Other Ambulatory Visit: Payer: Medicaid Other

## 2013-02-15 ENCOUNTER — Telehealth: Payer: Self-pay | Admitting: Obstetrics and Gynecology

## 2013-02-15 VITALS — BP 112/68 | Wt 248.0 lb

## 2013-02-16 ENCOUNTER — Ambulatory Visit: Payer: Medicaid Other | Admitting: Certified Nurse Midwife

## 2013-02-16 ENCOUNTER — Other Ambulatory Visit: Payer: Self-pay

## 2013-02-16 ENCOUNTER — Other Ambulatory Visit: Payer: Medicaid Other

## 2013-02-16 ENCOUNTER — Ambulatory Visit: Payer: Medicaid Other

## 2013-02-16 ENCOUNTER — Encounter: Payer: Self-pay | Admitting: Certified Nurse Midwife

## 2013-02-16 VITALS — BP 118/60 | Wt 249.0 lb

## 2013-02-16 DIAGNOSIS — Z3689 Encounter for other specified antenatal screening: Secondary | ICD-10-CM

## 2013-02-16 DIAGNOSIS — O26849 Uterine size-date discrepancy, unspecified trimester: Secondary | ICD-10-CM

## 2013-02-16 DIAGNOSIS — Z363 Encounter for antenatal screening for malformations: Secondary | ICD-10-CM

## 2013-02-16 DIAGNOSIS — Z331 Pregnant state, incidental: Secondary | ICD-10-CM

## 2013-02-16 DIAGNOSIS — D219 Benign neoplasm of connective and other soft tissue, unspecified: Secondary | ICD-10-CM

## 2013-02-16 DIAGNOSIS — Z1389 Encounter for screening for other disorder: Secondary | ICD-10-CM

## 2013-02-16 LAB — US OB COMP + 14 WK

## 2013-02-16 NOTE — Progress Notes (Signed)
Pt. Left yesterday before appt. With Dr. Su Hilt.  States she was told by "nurse" [place] was not scheduled.  Left after drinking glucola but did not await blood draw. Reviewed Quad Screen which was abn.  [place] was done today for anatomy scan but incomplete due to LMP/Dates and [place] discrepancy.  Otherwise nl. Pt. Informed of labs and [place].  Will re: calculate Quad screen with adjusted dates.  Will need to repeat glucose at next visit "agreeable".  Discussed second Second trimester changes with pg.reviewed Dr. Su Hilt inquired about pt. US/Quad screen reviewed. Sch. [place] and follow up in two weeks.Re: calculate Quad Screen with new EDC.   Hulen Luster, CNM

## 2013-02-16 NOTE — Progress Notes (Signed)
[redacted]w[redacted]d Ultrasound shows:  SIUP  S<D     [place] EDD: 07/26/2013            AFI n/a           Cervical length:3.31cm           Placenta localization: anterior           Fetal presentation: transverse                   Anatomy survey is normal           Gender : Not complete due to early GA and fetal position Comments: two fibroids seen  Fibroid 1 is midline LUS subserosal 4.7 x 4.1 x 4.6 CM Fibroid 2 posterior fundal subserosal 2.8x2.5x2.5 GA by U/S is 3 weeks less than GA by LMP best GA / EDD is by patient is 19 w 6 d by LMP Suggest f/u in 2 weeks  To complete anatomy

## 2013-02-19 ENCOUNTER — Telehealth: Payer: Self-pay | Admitting: Obstetrics and Gynecology

## 2013-02-19 NOTE — Telephone Encounter (Signed)
Pt called, states has a sinus cold and is congested and nothing is running out, wants to know what she can do or take.  Pt advised saline nasal spray or a netty pot, may apply some Vick's vapor rub under her nose and may take plain Sudafed and make she is drinking plenty of fluids.  Pt voices agreement, will try suggestions and will call back if no relief.

## 2013-02-19 NOTE — Progress Notes (Signed)
Per CA need to have date on quad screen changed and results adjusted. Per lab pt will need to have quad screen drawn again. With change in GA when pt had drawn she was too early. Pt will have this redone at apt with LC, np on 03/01/2013  Darien Ramus, CMA

## 2013-02-27 ENCOUNTER — Other Ambulatory Visit: Payer: Self-pay

## 2013-02-27 DIAGNOSIS — Z3689 Encounter for other specified antenatal screening: Secondary | ICD-10-CM

## 2013-03-01 ENCOUNTER — Ambulatory Visit: Payer: Medicaid Other

## 2013-03-01 ENCOUNTER — Ambulatory Visit: Payer: Medicaid Other | Admitting: Family Medicine

## 2013-03-01 VITALS — BP 110/70 | Wt 245.0 lb

## 2013-03-01 DIAGNOSIS — Z3689 Encounter for other specified antenatal screening: Secondary | ICD-10-CM

## 2013-03-01 DIAGNOSIS — Z3490 Encounter for supervision of normal pregnancy, unspecified, unspecified trimester: Secondary | ICD-10-CM

## 2013-03-01 LAB — US OB FOLLOW UP

## 2013-03-01 LAB — CBC
HCT: 38 % (ref 36.0–46.0)
Hemoglobin: 13 g/dL (ref 12.0–15.0)
MCH: 32.7 pg (ref 26.0–34.0)
MCHC: 34.2 g/dL (ref 30.0–36.0)
MCV: 95.7 fL (ref 78.0–100.0)
Platelets: 176 K/uL (ref 150–400)
RBC: 3.97 MIL/uL (ref 3.87–5.11)
RDW: 13.5 % (ref 11.5–15.5)
WBC: 9.2 10*3/uL (ref 4.0–10.5)

## 2013-03-01 NOTE — Progress Notes (Signed)
[redacted]w[redacted]d Complains of cramping becoming more frequent. Ultrasound shows:  SIUP  S=D     [place] EDD: 07/26/2013            AFI: Normal           Cervix is closed           Placenta localization: anterior           Fetal presentation: vertex                   Anatomy is not well seen           Vertical Pocket=4.1cm                                  Note:Previously Unseen anatomy seen today( profile, face,ROVt,4 chambers heart feet, Diaph. 3VV                                   Profile Nasal Bone, and abdominal cord insertion are still unseen due to fetal position. Suggest F/U in 4 wks to complete                                  Anatomy. Gender is not seen today.                                  Normal ovaries, No fluid in CDS, Normal [place].

## 2013-03-01 NOTE — Progress Notes (Signed)
[redacted]w[redacted]d Denies discharge, itching, VB, LOF, dysuria. Denies pelvic pressure and declines cervical exam today. Cramping intermittently 3-4 times daily. Admits to not drinking water. Encouraged increased water intake to 6-8 glasses of water/day.  Call office if changes includes LOF, VB. Cervix closed on U/S and length 3.05cm. Discussed U/S with patient.  No questions. Glucola/Quad done. ROB in 4 weeks with U/S. L.Nashia Remus, FNP-BC

## 2013-03-02 LAB — RPR

## 2013-03-02 LAB — GLUCOSE TOLERANCE, 1 HOUR (50G) W/O FASTING: Glucose, 1 Hour GTT: 67 mg/dL — ABNORMAL LOW (ref 70–140)

## 2013-03-09 ENCOUNTER — Ambulatory Visit: Payer: Medicaid Other | Admitting: Obstetrics and Gynecology

## 2013-03-09 ENCOUNTER — Telehealth: Payer: Self-pay | Admitting: Obstetrics and Gynecology

## 2013-03-09 ENCOUNTER — Encounter: Payer: Self-pay | Admitting: Obstetrics and Gynecology

## 2013-03-09 VITALS — BP 120/70 | Wt 259.0 lb

## 2013-03-09 DIAGNOSIS — O280 Abnormal hematological finding on antenatal screening of mother: Secondary | ICD-10-CM

## 2013-03-09 LAB — AFP, QUAD SCREEN
AFP: 259 [IU]/mL
Age Alone: 1:972 {titer}
Curr Gest Age: 19 wks.days
Down Syndrome Scr Risk Est: 1:38500 {titer}
HCG, Total: 7461 m[IU]/mL
INH: 599.1 pg/mL
Interpretation-AFP: NEGATIVE
MoM for AFP: 7.53
MoM for INH: 4.31
MoM for hCG: 0.62
Open Spina bifida: POSITIVE — AB
Osb Risk: >1:5 {titer}
Tri 18 Scr Risk Est: NEGATIVE
Trisomy 18 (Edward) Syndrome Interp.: 1:104 {titer}
uE3 Mom: 0.34
uE3 Value: 0.3 ng/mL

## 2013-03-09 NOTE — Telephone Encounter (Signed)
TC to pt. LM to return call.ASAP.  

## 2013-03-09 NOTE — Patient Instructions (Signed)
Alpha-Fetoprotein Alpha-Fetoprotein (AFP) is a protein made by the fetal liver. It is normally elevated in the newborn and the mother. In the infant, the value falls to adult values (normally less than 20 ng/ml (nanograms per milliliter) by one year of age. This protein is found in a number of abnormal tumors and conditions. It can be used for a screening test when this is appropriate. PREPARATION FOR TEST No preparation or fasting is required. ELEVATIONS OF AFP ARE CAUSED BY:  Primary hepatocellular carcinoma (cancer of the liver) and the level correlates with tumor size.  A screening test for embryonic teratocarcinomas, hepatoblastomas (tumor of the liver).  Rarely hepatic metastases (cancer spread) from the GI tract.  Some cholangiocarcinomas cause elevations greater than 400 ng/ml.  Rapidly progressing hepatitis can produce levels of AFP in excess of 1000ng/ml.  Lesser levels of hepatitis (inflammation of the liver) can produce levels of 100 to 400 ng/ml. NORMAL FINDINGS   Adult: Less than 40 ng/mL or Less than 40 mg/L (SI units).  Child younger than 1 year: Less than 30ng/mL. Ranges are stratified by weeks of gestation and vary among laboratories. Ranges for normal findings may vary among different laboratories and hospitals. You should always check with your doctor after having lab work or other tests done to discuss the meaning of your test results and whether your values are considered within normal limits. MEANING OF TEST  Your caregiver will go over the test results with you and discuss the importance and meaning of your results, as well as treatment options and the need for additional tests if necessary. OBTAINING THE TEST RESULTS  It is your responsibility to obtain your test results. Ask the lab or department performing the test when and how you will get your results. Document Released: 01/06/2005 Document Revised: 03/06/2012 Document Reviewed: 11/17/2008 [facility] Patient  Information 2013 [place], [place]. Amniocentesis Amniocentesis (amnio) is the removal of a small amount of fluid that surrounds the baby in the amniotic sac. Once the fluid is removed, it may be examined to find answers to a number of serious questions. An amnio is often done early in pregnancy (between 15 and 17 weeks) to determine if there is a complication with the baby, or it is done later in pregnancy (between 14 and 37 weeks) to see if the baby's lungs are mature. Amnios that are done later in pregnancy are often done to help weigh the risks and benefits of a needed early delivery. Amnios done early in the second trimester are commonly referred to as genetic amnios, as they are typically done to check for a potential life-altering genetic abnormality. Rarely, an amnio is done to evaluate the amniotic fluid for concerns of infection. Amniocentesis may be done for other reasons, including:   If the mother is 85 years old or older. This is because of the increased risk of chromosome abnormalities, such as Down's syndrome or various chromosomal trisomies.  To determine any genetic problems.  To look for signs of infection in the uterus.  To determine if the baby's lungs are mature enough for the baby to survive outside of the uterus. This information is important in pregnancies when the baby must be delivered early. LET YOUR CAREGIVER KNOW ABOUT:  Any complications you have had with the pregnancy, such as bleeding or contractions.  Allergies.  Medicines taken, including vitamins, herbs, eyedrops, over-the-counter medicines, and creams.  Use of steroids (by mouth or creams).  Previous problems with numbing medicines.  History of bleeding problems or  blood clots.  Previous surgery.  Other health problems. RISKS AND COMPLICATIONS  Complications can include:  Vaginal bleeding.  Transmission of an infection from mother to baby, such as hepatitis C or other viruses.  Leaking of amniotic  fluid.  Premature labor.  Fetal injury.  Injury to the placenta.  Miscarriage (rare). This procedure is done only if your caregiver feels the information obtained from the procedure justifies the risk. Amnios should not be performed before 15 weeks of pregnancy unless it is absolutely necessary. BEFORE THE PROCEDURE   Ask your caregiver any questions you may have.  Eat as usual.  Drink enough fluids to keep your urine clear or pale yellow.  You may want to arrange for someone to drive you home after the procedure. PROCEDURE  A careful ultrasound is done to evaluate the baby for its position and for where the best pockets of fluid lie. The mother's abdomen is prepped with a solution to prevent infections. Often, a sterile ultrasound probe is used to watch the location of the amniocentesis needle being used. A small spot of the skin may be injected with a numbing medicine. In that location, a longer needle is introduced through the skin and down to the level of the baby. Amniotic fluid is removed into a syringe and sent to the lab for evaluation. AFTER THE PROCEDURE   Ask your caregiver if you need a RhoGam shot.  You will rest for 1 to 2 hours for observation.  Your baby will be placed on a monitor for 1 to 2 hours to see if there are any problems.  You may develop cramping and a small amount of vaginal bleeding right after the amnio.  Ask when your test results will be ready. Make sure you get your test results. Document Released: 12/10/2000 Document Revised: 03/06/2012 Document Reviewed: 10/18/2011 [facility] Patient Information 2013 [place], [place].

## 2013-03-09 NOTE — Telephone Encounter (Signed)
Pt returned call . Scheduled to discuss results with Dr AVS today.

## 2013-03-09 NOTE — Progress Notes (Signed)
[redacted]w[redacted]d  Pt unable to use restroom at this time pt given water.  Pt has no concerns today. Pt stated she does not know why she was scheduled an appt today.

## 2013-03-09 NOTE — Telephone Encounter (Signed)
TC to pt.  LM to return call. Asap

## 2013-03-09 NOTE — Progress Notes (Signed)
[redacted]w[redacted]d The patient has had 2 abnormal quad screen.  Her anatomy ultrasound was normal although not all anatomy was seen. Repeat ultrasound and amniocentesis discussed.  Risk and benefits reviewed including risk of miscarriage.  Spina bifida discussed.  The patient was given written information to consider.  She wants to proceed with amniocentesis next week.  We will schedule. Dr. Stefano Gaul

## 2013-03-13 ENCOUNTER — Other Ambulatory Visit: Payer: Self-pay | Admitting: Obstetrics and Gynecology

## 2013-03-13 ENCOUNTER — Other Ambulatory Visit: Payer: Self-pay

## 2013-03-13 DIAGNOSIS — O350XX1 Maternal care for (suspected) central nervous system malformation in fetus, fetus 1: Secondary | ICD-10-CM

## 2013-03-13 DIAGNOSIS — O350XX0 Maternal care for (suspected) central nervous system malformation in fetus, not applicable or unspecified: Secondary | ICD-10-CM

## 2013-03-15 ENCOUNTER — Telehealth: Payer: Self-pay | Admitting: Obstetrics and Gynecology

## 2013-03-15 NOTE — Telephone Encounter (Signed)
Telephone call from patient who was recommended to have an amniocentesis because of an increased risk of Spina Bifida. Patient states she no longer wants to have an Amnio and wants to proceed with the Detailed anatomic screening here at our office, not at [facility]. Patient scheduled 3/21 @ 10:00. -ap

## 2013-03-16 ENCOUNTER — Ambulatory Visit (HOSPITAL_COMMUNITY): Payer: Self-pay

## 2013-03-16 ENCOUNTER — Other Ambulatory Visit: Payer: Self-pay | Admitting: Obstetrics and Gynecology

## 2013-03-16 ENCOUNTER — Other Ambulatory Visit: Payer: Medicaid Other

## 2013-03-16 ENCOUNTER — Ambulatory Visit: Payer: Medicaid Other | Admitting: Certified Nurse Midwife

## 2013-03-16 ENCOUNTER — Encounter: Payer: Self-pay | Admitting: Certified Nurse Midwife

## 2013-03-16 ENCOUNTER — Encounter (HOSPITAL_COMMUNITY): Payer: Self-pay | Admitting: Obstetrics and Gynecology

## 2013-03-16 VITALS — BP 110/60 | Wt 255.0 lb

## 2013-03-16 DIAGNOSIS — O351XX3 Maternal care for (suspected) chromosomal abnormality in fetus, fetus 3: Secondary | ICD-10-CM

## 2013-03-16 DIAGNOSIS — O280 Abnormal hematological finding on antenatal screening of mother: Secondary | ICD-10-CM

## 2013-03-16 DIAGNOSIS — O350XX0 Maternal care for (suspected) central nervous system malformation in fetus, not applicable or unspecified: Secondary | ICD-10-CM

## 2013-03-16 DIAGNOSIS — O2692 Pregnancy related conditions, unspecified, second trimester: Secondary | ICD-10-CM

## 2013-03-16 LAB — US OB DETAIL + 14 WK

## 2013-03-16 NOTE — Progress Notes (Signed)
   [redacted]w[redacted]d  No complaints

## 2013-03-16 NOTE — Progress Notes (Deleted)
  Subjective:    Patient ID: Sarah Oconnor, female    DOB: 1986-08-24, 27 y.o.   MRN: [id number]  HPI ab    Review of Systems     Objective:   Physical Exam        Assessment & Plan:

## 2013-03-16 NOTE — Progress Notes (Signed)
Patient ID: Sarah Oconnor, female   DOB: 1986-02-18, 27 y.o.   MRN: [id number] Pt. Is here to discuss follow up U She has had 2 abnormal quad screens Had a MD consult with Dr. Stefano Gaul and was to have  amnio. Pt. Cancelled. US done today was abnormal  Summary: Oligohydramnios, EFW is less than 10th %  Abnormal linear growth  Echogenic bowel  Pericaridal effusion  FHT's noted to be in 120's with decels PE:  Obese, Uterus 1 cm below umb  Ass: Abn. [place] and quad screens  Plan: Support offered  Discussed [place] after discussion with Dr. Stefano Gaul  Pt. Declines amnio. Consents to appt. With Fetal Medicine  Pt. Is to follow up with MD after [place]   Appts. Scheduled by N. Shellaway RN  Balanced diet, continue vitamins  Willaim Sheng, CNM, FNP

## 2013-03-20 ENCOUNTER — Other Ambulatory Visit: Payer: Self-pay | Admitting: Obstetrics and Gynecology

## 2013-03-20 DIAGNOSIS — I313 Pericardial effusion (noninflammatory): Secondary | ICD-10-CM

## 2013-03-20 DIAGNOSIS — O4100X1 Oligohydramnios, unspecified trimester, fetus 1: Secondary | ICD-10-CM

## 2013-03-21 ENCOUNTER — Inpatient Hospital Stay (HOSPITAL_COMMUNITY)
Admission: RE | Admit: 2013-03-21 | Discharge: 2013-03-23 | DRG: 779 | Disposition: A | Discharge status: Home / Self Care | Payer: Medicaid Other | Source: Ambulatory Visit | Attending: Obstetrics and Gynecology | Admitting: Obstetrics and Gynecology

## 2013-03-21 ENCOUNTER — Encounter (HOSPITAL_COMMUNITY): Payer: Self-pay | Admitting: *Deleted

## 2013-03-21 ENCOUNTER — Inpatient Hospital Stay (HOSPITAL_COMMUNITY)
Admission: AD | Admit: 2013-03-21 | Discharge: 2013-03-21 | Disposition: A | Discharge status: Home / Self Care | Payer: Medicaid Other | Source: Ambulatory Visit | Attending: Obstetrics and Gynecology | Admitting: Obstetrics and Gynecology

## 2013-03-21 ENCOUNTER — Other Ambulatory Visit: Payer: Self-pay | Admitting: Certified Nurse Midwife

## 2013-03-21 ENCOUNTER — Encounter (HOSPITAL_COMMUNITY): Payer: Self-pay

## 2013-03-21 ENCOUNTER — Inpatient Hospital Stay (HOSPITAL_COMMUNITY): Payer: Medicaid Other

## 2013-03-21 DIAGNOSIS — O280 Abnormal hematological finding on antenatal screening of mother: Secondary | ICD-10-CM | POA: Insufficient documentation

## 2013-03-21 DIAGNOSIS — O021 Missed abortion: Secondary | ICD-10-CM | POA: Insufficient documentation

## 2013-03-21 DIAGNOSIS — IMO0002 Reserved for concepts with insufficient information to code with codable children: Secondary | ICD-10-CM | POA: Diagnosis present

## 2013-03-21 DIAGNOSIS — O368190 Decreased fetal movements, unspecified trimester, not applicable or unspecified: Secondary | ICD-10-CM | POA: Insufficient documentation

## 2013-03-21 DIAGNOSIS — R109 Unspecified abdominal pain: Secondary | ICD-10-CM | POA: Insufficient documentation

## 2013-03-21 DIAGNOSIS — O365990 Maternal care for other known or suspected poor fetal growth, unspecified trimester, not applicable or unspecified: Secondary | ICD-10-CM | POA: Diagnosis present

## 2013-03-21 DIAGNOSIS — O4100X0 Oligohydramnios, unspecified trimester, not applicable or unspecified: Secondary | ICD-10-CM | POA: Diagnosis present

## 2013-03-21 DIAGNOSIS — E669 Obesity, unspecified: Secondary | ICD-10-CM

## 2013-03-21 HISTORY — DX: Obesity, unspecified: E66.9

## 2013-03-21 HISTORY — DX: Reserved for concepts with insufficient information to code with codable children: IMO0002

## 2013-03-21 HISTORY — DX: Benign neoplasm of connective and other soft tissue, unspecified: D21.9

## 2013-03-21 LAB — URINALYSIS, ROUTINE W REFLEX MICROSCOPIC
Hgb urine dipstick: NEGATIVE
Ketones, ur: NEGATIVE mg/dL
Protein, ur: NEGATIVE mg/dL
Urobilinogen, UA: 1 mg/dL (ref 0.0–1.0)

## 2013-03-21 MED ORDER — LIDOCAINE HCL (PF) 1 % IJ SOLN
30.0000 mL | INTRAMUSCULAR | Status: DC | PRN
  Filled 2013-03-21: qty 30

## 2013-03-21 MED ORDER — OXYCODONE-ACETAMINOPHEN 5-325 MG PO TABS: 1.0000 | ORAL_TABLET | ORAL | Status: DC | PRN

## 2013-03-21 MED ORDER — ONDANSETRON HCL 4 MG/2ML IJ SOLN: 4.0000 mg | Freq: Four times a day (QID) | INTRAMUSCULAR | Status: DC | PRN

## 2013-03-21 MED ORDER — IBUPROFEN 600 MG PO TABS
600.0000 mg | ORAL_TABLET | Freq: Four times a day (QID) | ORAL | Status: DC | PRN
  Administered 2013-03-22: 600 mg via ORAL
  Filled 2013-03-21: qty 1

## 2013-03-21 MED ORDER — HYDROCODONE-ACETAMINOPHEN 5-325 MG PO TABS
1.0000 | ORAL_TABLET | Freq: Once | ORAL | Status: AC
  Administered 2013-03-21: 2 via ORAL
  Filled 2013-03-21: qty 2

## 2013-03-21 MED ORDER — ACETAMINOPHEN 325 MG PO TABS: 650.0000 mg | ORAL_TABLET | ORAL | Status: DC | PRN

## 2013-03-21 MED ORDER — PROMETHAZINE HCL 25 MG/ML IJ SOLN
25.0000 mg | Freq: Four times a day (QID) | INTRAMUSCULAR | Status: DC | PRN
  Administered 2013-03-22: 25 mg via INTRAVENOUS
  Filled 2013-03-21: qty 1

## 2013-03-21 MED ORDER — FENTANYL CITRATE 0.05 MG/ML IJ SOLN
100.0000 ug | INTRAMUSCULAR | Status: DC | PRN
  Administered 2013-03-22 (×2): 100 ug via INTRAVENOUS
  Filled 2013-03-21 (×2): qty 2

## 2013-03-21 MED ORDER — MISOPROSTOL 200 MCG PO TABS
200.0000 ug | ORAL_TABLET | Freq: Four times a day (QID) | ORAL | Status: DC
  Filled 2013-03-21: qty 1

## 2013-03-21 MED ORDER — LACTATED RINGERS IV SOLN: INTRAVENOUS | Status: DC

## 2013-03-21 MED ORDER — LACTATED RINGERS IV SOLN: 500.0000 mL | INTRAVENOUS | Status: DC | PRN

## 2013-03-21 MED ORDER — HYDROXYZINE HCL 50 MG/ML IM SOLN: 50.0000 mg | INTRAMUSCULAR | Status: DC | PRN

## 2013-03-21 MED ORDER — CITRIC ACID-SODIUM CITRATE 334-500 MG/5ML PO SOLN: 30.0000 mL | ORAL | Status: DC | PRN

## 2013-03-21 MED ORDER — HYDROXYZINE HCL 50 MG PO TABS
50.0000 mg | ORAL_TABLET | ORAL | Status: DC | PRN
  Administered 2013-03-22: 50 mg via ORAL
  Filled 2013-03-21 (×2): qty 1

## 2013-03-21 MED ORDER — BUTORPHANOL TARTRATE 1 MG/ML IJ SOLN
1.0000 mg | INTRAMUSCULAR | Status: DC | PRN
  Administered 2013-03-22 (×2): 1 mg via INTRAVENOUS
  Filled 2013-03-21: qty 2
  Filled 2013-03-21 (×2): qty 1

## 2013-03-21 MED ORDER — MISOPROSTOL 100 MCG PO TABS
100.0000 ug | ORAL_TABLET | Freq: Once | ORAL | Status: AC
  Administered 2013-03-21: 100 ug via VAGINAL
  Filled 2013-03-21: qty 1

## 2013-03-21 MED ORDER — MISOPROSTOL 200 MCG PO TABS
200.0000 ug | ORAL_TABLET | Freq: Once | ORAL | Status: AC
  Administered 2013-03-22: 200 ug via VAGINAL
  Filled 2013-03-21: qty 1

## 2013-03-21 NOTE — MAU Note (Signed)
Dr. Normand Sloop and Haroldine Laws, CNM at ultrasound bedside to offer support to pt and FOB due to fetal demise confirmed on ultrasound today.

## 2013-03-21 NOTE — H&P (Signed)
Sarah Oconnor is a 27 y.o. female presenting for sharp lower abdominal pain and decreased FM.  History OB History   Grav Para Term Preterm Abortions TAB SAB Ect Mult Living   2 1 1       1      Past Medical History  Diagnosis Date  . Migraine     Imitrex taken in the past  . Abnormal Pap smear 2008;2013    Rpt  paps have been abnl;Last papn 2012 was abnormal  . Anemia     Iron supplements taken in the past;during last pregnancy  . Infection     BV x 3;currently being treated for infection  . Infection     Yeast;not frequent   Past Surgical History  Procedure Laterality Date  . Breast surgery      Non cancerous mass removed @ 12 yoa   Family History: family history includes Asthma in her sister; Diabetes in her maternal grandfather; Hypertension in her maternal grandfather and mother; Migraines in her brother, maternal grandmother, maternal uncle, mother, and sister; and Other in her maternal grandmother. Social History:  reports that she has never smoked. She has never used smokeless tobacco. She reports that she does not drink alcohol or use illicit drugs.   Prenatal Transfer Tool  Maternal Diabetes: No Genetic Screening: Abnormal:  Results: Elevated AFP Maternal Ultrasounds/Referrals: Abnormal:  Findings:   Echogenic bowel, Other: Pericardial Effusion and Oligo Fetal Ultrasounds or other Referrals:  Referred to Materal Fetal Medicine  Maternal Substance Abuse:  No Significant Maternal Medications:  None Significant Maternal Lab Results:  None Other Comments:  None  ROS - see HPI  Dilation: Closed Effacement (%):  (25) Exam by:: Dr. Normand Sloop and Starling Manns, CNM Blood pressure 144/88, pulse 91, temperature 97.8 F (36.6 C), temperature source Oral, resp. rate 20, height 5\' 6"  (1.676 m), weight 255 lb (115.667 kg), last menstrual period 09/30/2012, SpO2 100.00%.   Exam Physical Exam   Prenatal labs: ABO, Rh: O/POS/-- (11/13 1119) Antibody: NEG (11/13  1119) Rubella: 217.8 (11/13 1119) RPR: NON REAC (03/06 1153)  HBsAg: NEGATIVE (11/13 1119)  HIV: NON REACTIVE (11/13 1119)  GBS:     Assessment/Plan: IUFD by U/S at 21 weeks Desired to go home to take care of family needs and then will return  Plan cytotec for induction  Sarah Oconnor 03/21/2013, 3:08 PM

## 2013-03-21 NOTE — H&P (Signed)
Sarah Oconnor is a 27 y.o. female presenting for sharp lower abdominal pain and decreased FM.  History  OB History    Grav  Para  Term  Preterm  Abortions  TAB  SAB  Ect  Mult  Living    2  1  1        1       Past Medical History   Diagnosis  Date   .  Migraine      Imitrex taken in the past   .  Abnormal Pap smear  2008;2013     Rpt paps have been abnl;Last papn 2012 was abnormal   .  Anemia      Iron supplements taken in the past;during last pregnancy   .  Infection      BV x 3;currently being treated for infection   .  Infection      Yeast;not frequent    Past Surgical History   Procedure  Laterality  Date   .  Breast surgery       Non cancerous mass removed @ 12 yoa   Family History: family history includes Asthma in her sister; Diabetes in her maternal grandfather; Hypertension in her maternal grandfather and mother; Migraines in her brother, maternal grandmother, maternal uncle, mother, and sister; and Other in her maternal grandmother.  Social History: reports that she has never smoked. She has never used smokeless tobacco. She reports that she does not drink alcohol or use illicit drugs.  Prenatal Transfer Tool   Maternal Diabetes: No  Genetic Screening: Abnormal: Results: Elevated AFP  Maternal Ultrasounds/Referrals: Abnormal: Findings: Echogenic bowel, Other: Pericardial Effusion and Oligo  Fetal Ultrasounds or other Referrals: Referred to Materal Fetal Medicine  Maternal Substance Abuse: No  Significant Maternal Medications: None  Significant Maternal Lab Results: None  Other Comments: None  ROS - see HPI  Dilation: Closed  Effacement (%): (25)  Exam by:: Dr. Normand Sloop and Starling Manns, CNM  Blood pressure 144/88, pulse 91, temperature 97.8 F (36.6 C), temperature source Oral, resp. rate 20, height 5\' 6"  (1.676 m), weight 255 lb (115.667 kg), last menstrual period 09/30/2012, SpO2 100.00%.  Exam  Physical Exam  Prenatal labs:  ABO, Rh: O/POS/-- (11/13 1119)   Antibody: NEG (11/13 1119)  Rubella: 217.8 (11/13 1119)  RPR: NON REAC (03/06 1153)  HBsAg: NEGATIVE (11/13 1119)  HIV: NON REACTIVE (11/13 1119)  GBS:  Assessment/Plan:  IUFD by U/S at 21 weeks    Plan cytotec for induction  OXLEY, JENNIFER  03/21/2013, 3:08 PM   Hx and PE reviewed. I agree wih the plan.  Dr. Stefano Gaul

## 2013-03-21 NOTE — MAU Note (Signed)
Patient state she has been having lower sharp abdominal pain for about 2 hours. Denies leaking or bleeding. Unsure if she has felt fetal movement, Denies nausea, vomiting or diarrhea.

## 2013-03-21 NOTE — Progress Notes (Signed)
Sarah Oconnor is a 27 y.o. G2P1001 at [redacted]w[redacted]d admitted for induction of labor due to IUFD.  Subjective: Pt denies pain, but feels pressure, when stands, "like i could pee buckets."  Pt also feels "anxious," and requested something to calm her.   [name] and boyfriend are at bs.  Pt ate after arrival to hospital.  Her 7y.o. Daughter is staying with a friend.  Pt had spontaneous, natural CB w/ daughter in [place], [place].  Pt is working on her master's in education currently; not employed.  Pt denies VB or LOF.  No recent trips or changes in diet.  BM's normal.  Some sinus congestion, but no other resp c/o's.  No fever or chills.    Objective: BP 126/83  Pulse 77  Temp(Src) 98.5 F (36.9 C) (Oral)  Resp 20  Ht 5\' 6"  (1.676 m)  Wt 255 lb (115.667 kg)  BMI 41.18 kg/m2  LMP 09/30/2012      [name]:  Closed/long/high; anterior, medium firmness    Labs: Lab Results  Component Value Date   WBC 9.2 03/01/2013   HGB 13.0 03/01/2013   HCT 38.0 03/01/2013   MCV 95.7 03/01/2013   PLT 176 03/01/2013    Assessment / Plan: 1. [redacted]w[redacted]d 2. IUFD 3. abnormal quad screen with suspected anomalies/aneuploidy 4. oligo and IUGR seen on most recent u/s  1. Discussed POC w/ pt, s.o., and pt's mother, and support given r/e loss.  Disc'd cytotec induction and what to expect.  Presently pt doesn't desire epidural in process, and plans to try IV pain medication prn.  Disc'd w/ pt high risk for PPH and retained products and possible need for D&E if those conditions arise.   2.  Pt does desire autopsy.   3. Placed of cytotec at 2310  4. Vistaril IV x1 now, then prn 5. TORCH titers, lupus anticoagulant panel, TSH, antiphospholipid eval, CMET, Parvovirus, UDS, gc/ct, GBS, CBC, and ANA all ordered secondary to IUFD and those needing repeat in 2 weeks will be done at office at that time.   6. C/w MD prn  Sarah Oconnor H 03/21/2013, 11:33 PM

## 2013-03-22 ENCOUNTER — Ambulatory Visit (HOSPITAL_COMMUNITY): Payer: Medicaid Other

## 2013-03-22 ENCOUNTER — Ambulatory Visit (HOSPITAL_COMMUNITY): Admission: RE | Admit: 2013-03-22 | Payer: Medicaid Other | Source: Ambulatory Visit

## 2013-03-22 ENCOUNTER — Encounter (HOSPITAL_COMMUNITY): Payer: Self-pay | Admitting: *Deleted

## 2013-03-22 LAB — COMPREHENSIVE METABOLIC PANEL
ALT: 22 U/L (ref 0–35)
AST: 35 U/L (ref 0–37)
Albumin: 2.7 g/dL — ABNORMAL LOW (ref 3.5–5.2)
Alkaline Phosphatase: 158 U/L — ABNORMAL HIGH (ref 39–117)
Chloride: 100 mEq/L (ref 96–112)
Potassium: 4.1 mEq/L (ref 3.5–5.1)
Sodium: 134 mEq/L — ABNORMAL LOW (ref 135–145)
Total Bilirubin: 0.2 mg/dL — ABNORMAL LOW (ref 0.3–1.2)

## 2013-03-22 LAB — ANA: Anti Nuclear Antibody(ANA): NEGATIVE

## 2013-03-22 LAB — RAPID URINE DRUG SCREEN, HOSP PERFORMED
Amphetamines: NOT DETECTED
Cocaine: NOT DETECTED
Opiates: POSITIVE — AB
Tetrahydrocannabinol: POSITIVE — AB

## 2013-03-22 LAB — CBC
HCT: 36 % (ref 36.0–46.0)
MCHC: 35.6 g/dL (ref 30.0–36.0)
MCV: 94 fL (ref 78.0–100.0)
RDW: 12.8 % (ref 11.5–15.5)

## 2013-03-22 LAB — TOXOPLASMA GONDII ANTIBODY, IGG: Toxoplasma IgG Ratio: <3 IU/mL (ref <6.0)

## 2013-03-22 LAB — TSH: TSH: 1.18 u[IU]/mL (ref 0.350–4.500)

## 2013-03-22 LAB — ABO/RH: ABO/RH(D): O POS

## 2013-03-22 MED ORDER — KETOROLAC TROMETHAMINE 30 MG/ML IJ SOLN: 30.0000 mg | Freq: Once | INTRAMUSCULAR | Status: DC

## 2013-03-22 MED ORDER — ONDANSETRON HCL 4 MG/2ML IJ SOLN: 4.0000 mg | INTRAMUSCULAR | Status: DC | PRN

## 2013-03-22 MED ORDER — TETANUS-DIPHTH-ACELL PERTUSSIS 5-2.5-18.5 LF-MCG/0.5 IM SUSP
0.5000 mL | Freq: Once | INTRAMUSCULAR | Status: AC
  Administered 2013-03-23: 0.5 mL via INTRAMUSCULAR
  Filled 2013-03-22: qty 0.5

## 2013-03-22 MED ORDER — WITCH HAZEL-GLYCERIN EX PADS: 1.0000 "application " | MEDICATED_PAD | CUTANEOUS | Status: DC | PRN

## 2013-03-22 MED ORDER — ONDANSETRON HCL 4 MG PO TABS: 4.0000 mg | ORAL_TABLET | ORAL | Status: DC | PRN

## 2013-03-22 MED ORDER — PRENATAL MULTIVITAMIN CH
1.0000 | ORAL_TABLET | Freq: Every day | ORAL | Status: DC
  Administered 2013-03-23: 1 via ORAL
  Filled 2013-03-22: qty 1

## 2013-03-22 MED ORDER — DIBUCAINE 1 % RE OINT: 1.0000 "application " | TOPICAL_OINTMENT | RECTAL | Status: DC | PRN

## 2013-03-22 MED ORDER — METHYLERGONOVINE MALEATE 0.2 MG/ML IJ SOLN: 0.2000 mg | INTRAMUSCULAR | Status: DC | PRN

## 2013-03-22 MED ORDER — OXYCODONE-ACETAMINOPHEN 5-325 MG PO TABS
1.0000 | ORAL_TABLET | ORAL | Status: DC | PRN
  Administered 2013-03-23 (×2): 2 via ORAL
  Filled 2013-03-22 (×2): qty 2

## 2013-03-22 MED ORDER — DIPHENHYDRAMINE HCL 25 MG PO CAPS
25.0000 mg | ORAL_CAPSULE | Freq: Four times a day (QID) | ORAL | Status: DC | PRN
  Administered 2013-03-23 (×2): 25 mg via ORAL
  Filled 2013-03-22 (×2): qty 1

## 2013-03-22 MED ORDER — OXYTOCIN 40 UNITS IN LACTATED RINGERS INFUSION - SIMPLE MED
INTRAVENOUS | Status: AC
  Administered 2013-03-22: 40 [IU]
  Filled 2013-03-22: qty 1000

## 2013-03-22 MED ORDER — MISOPROSTOL 200 MCG PO TABS
400.0000 ug | ORAL_TABLET | ORAL | Status: DC
  Administered 2013-03-22: 400 ug via VAGINAL
  Filled 2013-03-22 (×2): qty 2

## 2013-03-22 MED ORDER — SIMETHICONE 80 MG PO CHEW: 80.0000 mg | CHEWABLE_TABLET | ORAL | Status: DC | PRN

## 2013-03-22 MED ORDER — ZOLPIDEM TARTRATE 5 MG PO TABS
5.0000 mg | ORAL_TABLET | Freq: Every evening | ORAL | Status: DC | PRN
  Administered 2013-03-22: 5 mg via ORAL
  Filled 2013-03-22: qty 1

## 2013-03-22 MED ORDER — BUTORPHANOL TARTRATE 1 MG/ML IJ SOLN
2.0000 mg | INTRAMUSCULAR | Status: DC | PRN
  Administered 2013-03-22: 2 mg via INTRAVENOUS

## 2013-03-22 MED ORDER — IBUPROFEN 600 MG PO TABS
600.0000 mg | ORAL_TABLET | Freq: Four times a day (QID) | ORAL | Status: DC
  Administered 2013-03-22 – 2013-03-23 (×3): 600 mg via ORAL
  Filled 2013-03-22 (×3): qty 1

## 2013-03-22 MED ORDER — BENZOCAINE-MENTHOL 20-0.5 % EX AERO: 1.0000 "application " | INHALATION_SPRAY | CUTANEOUS | Status: DC | PRN

## 2013-03-22 MED ORDER — LANOLIN HYDROUS EX OINT: TOPICAL_OINTMENT | CUTANEOUS | Status: DC | PRN

## 2013-03-22 MED ORDER — SENNOSIDES-DOCUSATE SODIUM 8.6-50 MG PO TABS: 2.0000 | ORAL_TABLET | Freq: Every day | ORAL | Status: DC

## 2013-03-22 MED ORDER — METHYLERGONOVINE MALEATE 0.2 MG PO TABS: 0.2000 mg | ORAL_TABLET | ORAL | Status: DC | PRN

## 2013-03-22 NOTE — Progress Notes (Signed)
Subjective: Pt is feeling discomfort, but declines need for pain medicine or other intervention.  [name] awake at bs and s.o. Asleep.  Pt denies any VB or LOF.  Objective: BP 119/76  Pulse 77  Temp(Src) 98.4 F (36.9 C) (Oral)  Resp 20  Ht 5\' 6"  (1.676 m)  Wt 255 lb (115.667 kg)  BMI 41.18 kg/m2  LMP 09/30/2012     [name]:   Int os FT/ext os 1cm/25%/-3; anterior Labs: Lab Results  Component Value Date   WBC 10.0 03/22/2013   HGB 12.8 03/22/2013   HCT 36.0 03/22/2013   MCV 94.0 03/22/2013   PLT 164 03/22/2013    Assessment / Plan: 1. 22 weeks 2. IUFD  2. cytotec placed pv; continue w/ this dose q4hrs.  Support as needed.  GBS cx obtained this exam.  Enc'd pt to obtain pain intervention prn.  C/w MD prn. Sarah Oconnor H 03/22/2013, 7:38 AM

## 2013-03-22 NOTE — Progress Notes (Signed)
  Subjective: Sleeping s/p IV med dose.  Mom at bedside.  FOB has stepped out.    Objective: BP 132/84  Pulse 79  Temp(Src) 98.8 F (37.1 C) (Oral)  Resp 18  Ht 5\' 6"  (1.676 m)  Wt 255 lb (115.667 kg)  BMI 41.18 kg/m2  LMP 09/30/2012      VE deferred  Assessment / Plan: IUFD at 22 weeks S/P cytotech 400 mcg at 7:38am Will re-evaluate cervix at 11:30-12p for determination of plan for continuing cytotech.  Nigel Bridgeman 03/22/2013, 12:02 PM

## 2013-03-22 NOTE — Progress Notes (Signed)
Subjective: Resting off and on.  Denies pain or cramping, but continues to feel "pressure." Declines pain intervention or add'l vistaril at present.  Pt's s.o. Is asleep at bs, and pt's mom is resting off and on at [facility] as well.  No VB or LOF.  Objective: BP 126/83  Pulse 77  Temp(Src) 98.5 F (36.9 C) (Oral)  Resp 20  Ht 5\' 6"  (1.676 m)  Wt 255 lb (115.667 kg)  BMI 41.18 kg/m2  LMP 09/30/2012      [name]:   1/thick/high, anterior Labs: Lab Results  Component Value Date   WBC 10.0 03/22/2013   HGB 12.8 03/22/2013   HCT 36.0 03/22/2013   MCV 94.0 03/22/2013   PLT 164 03/22/2013    Assessment / Plan: 1. 22 weeks 2. IUFD 3. Abnormal quad screen w/ suspected aneuploidy/anomalies  1.. Placed next cytotec dose now ( ) pv.  Support as needed and will increase to in 4hrs if add'l doses needed. 2. C/w MD prn  Delories Mauri H 03/22/2013, 3:38 AM

## 2013-03-22 NOTE — Progress Notes (Signed)
03/22/13 1400  Clinical Encounter Type  Visited With Patient and family together;Health care provider (mom, RN Lauren)  Visit Type Spiritual support;Social support (Bereavement/IUFD)  Spiritual Encounters  Spiritual Needs Emotional;Grief support (Bereavement/IUFD)   Made initial contact with [name] and her mom after delivery, while they were having time with baby before baby is taken for autopsy.  Will check in with family before leaving today to offer support and will also refer back to Chaplain Dyanne Carrel for follow-up tomorrow, when grief care and education may be better timed.  Consulted with Lujean Rave, RN, re pt's and family's relationship with baby/pregnancy, delivery experience, expressions of grief, and other relevant details.  Spiritual Care will follow closely for support; please page as support needed/appropriate:  [phone].  Thank you!   [address] [place], [place] [phone]

## 2013-03-22 NOTE — Progress Notes (Signed)
  Subjective: Reports pain medication works short time--RN has been alternating Fentanyl and Stadol.  Last dose Stadol at 11:33a. Mother and FOB at bedside.  Patient communicative, feeling cramping but no pressure.  Objective: BP 132/84  Pulse 79  Temp(Src) 98.8 F (37.1 C) (Oral)  Resp 18  Ht 5\' 6"  (1.676 m)  Wt 255 lb (115.667 kg)  BMI 41.18 kg/m2  LMP 09/30/2012       UC:   Irregular, mild. [name]:   3 cm, thick, ?small parts palpated at os--membranes still intact.  Assessment / Plan: IUFD at 22 weeks Progressive labor s/p cytotech. Per patient request, will defer repeat dose of cytotech and AROM at present.  Will observe and re-evaluate in 2 hours (or prn) for further advancement of labor--if no change, will recommend repeat cytotech and/or AROM. Support to patient and family for loss.  Nigel Bridgeman 03/22/2013, 12:06 PM

## 2013-03-23 LAB — CBC
Platelets: 142 10*3/uL — ABNORMAL LOW (ref 150–400)
RDW: 13.1 % (ref 11.5–15.5)
WBC: 8.5 10*3/uL (ref 4.0–10.5)

## 2013-03-23 MED ORDER — OXYCODONE-ACETAMINOPHEN 5-325 MG PO TABS: 1.0000 | ORAL_TABLET | ORAL | Status: DC | PRN

## 2013-03-23 MED ORDER — ZOLPIDEM TARTRATE 5 MG PO TABS: 5.0000 mg | ORAL_TABLET | Freq: Every evening | ORAL | Status: DC | PRN

## 2013-03-23 MED ORDER — IBUPROFEN 600 MG PO TABS: 600.0000 mg | ORAL_TABLET | Freq: Four times a day (QID) | ORAL | Status: DC | PRN

## 2013-03-23 NOTE — Progress Notes (Signed)
Pt ambulated out with mom  Teaching complete   Has talked to funeral home

## 2013-03-23 NOTE — Progress Notes (Signed)
Sarah Oconnor and her family were processing all that had happened and were not in a mind frame where they wanted to talk at that moment.  They are aware of resources for support when they leave here and were appreciative of all the care that they have received.  [place] Shayann Garbutt Pager, [phone] 4:06 PM

## 2013-03-23 NOTE — Progress Notes (Signed)
UR chart review completed.  

## 2013-03-23 NOTE — Discharge Summary (Signed)
Obstetric Discharge Summary Reason for Admission: induction of labor and for fetal demise Prenatal Procedures: ultrasound Intrapartum Procedures: spontaneous vaginal delivery Postpartum Procedures: none Complications-Operative and Postpartum: none Hemoglobin  Date Value Range Status  03/23/2013 12.4  12.0 - 15.0 g/dL Final     HCT  Date Value Range Status  03/23/2013 36.4  36.0 - 46.0 % Final    Physical Exam:  General: alert, cooperative and no distress Lochia: appropriate Uterine Fundus: firm Incision:  n/a DVT Evaluation: No evidence of DVT seen on physical exam.  Discharge Diagnoses: s/p SVD of IUFD  Discharge Information: Date: 03/23/2013 Activity: pelvic rest Diet: routine Medications: Ibuprofen, Percocet and ambien Condition: stable Instructions: refer to practice specific booklet and pt to call with any PIH sxs Discharge to: home hospital course.  Pt came into the hospital for pain and was diagnosed with an IUFD at 22 weeks.  Her prenatal course was significant for abnormal QUAD screen and multiple fetal abnormalities on [place].  She declined amnio.  She was induced and the infant was sent for autopsy as well as chromosomal studies were sent.  Patients work up so far has been normal.  She will follow up in two weeks.  She has labile BPS and denies H/O [place].  Pt going to [place] with her mother.  She will check her BP there and call with any problems.. She declined counseling for now and has no depressive sxs.  She does require ambien for sleep  Newborn Data: Nonviable female Birth Weight: 8.8 oz (249 g) APGAR: 0, 0  Home with infant for autopsy.  Clytee Heinrich A 03/23/2013, 11:03 AM

## 2013-03-26 ENCOUNTER — Telehealth: Payer: Self-pay | Admitting: Obstetrics and Gynecology

## 2013-03-26 NOTE — Telephone Encounter (Signed)
Grief card mailed for loss on 03/22/13. -ap

## 2013-03-27 LAB — ANTIPHOSPHOLIPID SYNDROME EVAL, BLD
Anticardiolipin IgM: 1 MPL U/mL — ABNORMAL LOW (ref <11)
DRVVT: 30.3 secs (ref <42.9)
PTT Lupus Anticoagulant: 34.8 secs (ref 28.0–43.0)

## 2013-03-28 NOTE — Progress Notes (Signed)
Left without being seen.

## 2013-04-03 ENCOUNTER — Inpatient Hospital Stay (HOSPITAL_COMMUNITY)
Admission: AD | Admit: 2013-04-03 | Discharge: 2013-04-03 | Disposition: A | Discharge status: Home / Self Care | Payer: Medicaid Other | Source: Ambulatory Visit | Attending: Obstetrics and Gynecology | Admitting: Obstetrics and Gynecology

## 2013-04-03 ENCOUNTER — Encounter (HOSPITAL_COMMUNITY): Payer: Self-pay | Admitting: *Deleted

## 2013-04-03 DIAGNOSIS — IMO0002 Reserved for concepts with insufficient information to code with codable children: Secondary | ICD-10-CM | POA: Insufficient documentation

## 2013-04-03 DIAGNOSIS — G43909 Migraine, unspecified, not intractable, without status migrainosus: Secondary | ICD-10-CM | POA: Insufficient documentation

## 2013-04-03 DIAGNOSIS — R51 Headache: Secondary | ICD-10-CM

## 2013-04-03 LAB — URINALYSIS, ROUTINE W REFLEX MICROSCOPIC
Bilirubin Urine: NEGATIVE
Nitrite: NEGATIVE
Specific Gravity, Urine: 1.015 (ref 1.005–1.030)
pH: 6 (ref 5.0–8.0)

## 2013-04-03 LAB — COMPREHENSIVE METABOLIC PANEL
ALT: 26 U/L (ref 0–35)
AST: 22 U/L (ref 0–37)
Albumin: 3.2 g/dL — ABNORMAL LOW (ref 3.5–5.2)
Alkaline Phosphatase: 89 U/L (ref 39–117)
Glucose, Bld: 88 mg/dL (ref 70–99)
Potassium: 3.9 mEq/L (ref 3.5–5.1)
Sodium: 137 mEq/L (ref 135–145)
Total Protein: 7 g/dL (ref 6.0–8.3)

## 2013-04-03 LAB — CBC
Hemoglobin: 13.8 g/dL (ref 12.0–15.0)
MCHC: 34.7 g/dL (ref 30.0–36.0)
Platelets: 256 10*3/uL (ref 150–400)

## 2013-04-03 LAB — URINE MICROSCOPIC-ADD ON

## 2013-04-03 MED ORDER — METOCLOPRAMIDE HCL 5 MG/ML IJ SOLN
10.0000 mg | Freq: Once | INTRAMUSCULAR | Status: AC
  Administered 2013-04-03: 10 mg via INTRAVENOUS
  Filled 2013-04-03: qty 2

## 2013-04-03 MED ORDER — ESCITALOPRAM OXALATE 5 MG PO TABS: 5.0000 mg | ORAL_TABLET | Freq: Every day | ORAL | Status: DC

## 2013-04-03 MED ORDER — ZOLPIDEM TARTRATE 5 MG PO TABS: 5.0000 mg | ORAL_TABLET | Freq: Every evening | ORAL | Status: DC | PRN

## 2013-04-03 MED ORDER — BUTORPHANOL TARTRATE 1 MG/ML IJ SOLN
1.0000 mg | Freq: Once | INTRAMUSCULAR | Status: AC
  Administered 2013-04-03: 1 mg via INTRAVENOUS
  Filled 2013-04-03: qty 1

## 2013-04-03 MED ORDER — ISOMETHEPTENE-APAP-DICHLORAL 65-325-100 MG PO CAPS: 1.0000 | ORAL_CAPSULE | Freq: Four times a day (QID) | ORAL | Status: DC | PRN

## 2013-04-03 MED ORDER — LACTATED RINGERS IV SOLN: INTRAVENOUS | Status: DC

## 2013-04-03 MED ORDER — LACTATED RINGERS IV BOLUS (SEPSIS)
500.0000 mL | Freq: Once | INTRAVENOUS | Status: AC
  Administered 2013-04-03: 11:00:00 via INTRAVENOUS

## 2013-04-03 NOTE — MAU Note (Signed)
Pt states had IUFD last week, was given medication for headaches and to help her sleep. Has almost taken all of pain medication for headache. Still waking up with nightmares. States Remus Loffler works sometimes. Has migraine headache that is on left side only, from eye to back of head. Is concerned about her GPA in grad school, has multiple stressors.

## 2013-04-03 NOTE — MAU Note (Signed)
Social worker notified of consult for pt.

## 2013-04-03 NOTE — Progress Notes (Signed)
Chaplin's documentation noted.  CSW provided pt with income based counseling resources & recommend that pt is prescribed an anti-depressant/anxiety medication.  Pt is willing to start medication & therapy.  She denies any SI/HI.  No barriers to discharge.

## 2013-04-03 NOTE — Progress Notes (Signed)
04/03/13 1300  Clinical Encounter Type  Visited With Patient  Visit Type Spiritual support;Social support;Follow-up  Referral From Nurse  Spiritual Encounters  Spiritual Needs Emotional;Grief support  Stress Factors  Patient Stress Factors Loss (Per pt: sleep issues, nightmares, headaches assoc w grief)   Familiar with Sarah Oconnor from her last stay, when she went through her IUFD/delivery.  She reports having trouble sleeping, having nightmares that wake her up, avoiding sleep because she doesn't want to have nightmares, and having migraines/headaches.  We talked in detail about grieving, including both physical and emotional experiences that are common with grief.  I reiterated the invitation to reach out for further chaplain support through our [facility], as well as the referral to [facility] for one-on-one and small-group peer support, providing print material about both.  Encouraged Sarah Oconnor to consider counseling resources, as well, suggesting that her university may have free or inexpensive counseling resources as part of student health.  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  She was reflective, self-aware, and very appreciative of care today and especially from Lujean Rave, RN, on [employer] during her last stay.  [address] [place], [place] [phone]

## 2013-04-11 LAB — CHROMOSOME STD, POC(TISSUE)-NCBH

## 2013-04-26 DEATH — deceased

## 2013-07-30 ENCOUNTER — Encounter (HOSPITAL_COMMUNITY): Payer: Self-pay | Admitting: *Deleted

## 2013-07-30 ENCOUNTER — Inpatient Hospital Stay (HOSPITAL_COMMUNITY)
Admission: AD | Admit: 2013-07-30 | Discharge: 2013-07-30 | Disposition: A | Discharge status: Home / Self Care | Payer: Medicaid Other | Source: Ambulatory Visit | Attending: Obstetrics and Gynecology | Admitting: Obstetrics and Gynecology

## 2013-07-30 ENCOUNTER — Inpatient Hospital Stay (HOSPITAL_COMMUNITY): Payer: Medicaid Other

## 2013-07-30 DIAGNOSIS — R109 Unspecified abdominal pain: Secondary | ICD-10-CM | POA: Insufficient documentation

## 2013-07-30 DIAGNOSIS — B9689 Other specified bacterial agents as the cause of diseases classified elsewhere: Secondary | ICD-10-CM | POA: Insufficient documentation

## 2013-07-30 DIAGNOSIS — A499 Bacterial infection, unspecified: Secondary | ICD-10-CM | POA: Insufficient documentation

## 2013-07-30 DIAGNOSIS — O3410 Maternal care for benign tumor of corpus uteri, unspecified trimester: Secondary | ICD-10-CM | POA: Insufficient documentation

## 2013-07-30 DIAGNOSIS — O039 Complete or unspecified spontaneous abortion without complication: Secondary | ICD-10-CM | POA: Insufficient documentation

## 2013-07-30 DIAGNOSIS — O00109 Unspecified tubal pregnancy without intrauterine pregnancy: Secondary | ICD-10-CM | POA: Insufficient documentation

## 2013-07-30 DIAGNOSIS — N760 Acute vaginitis: Secondary | ICD-10-CM | POA: Insufficient documentation

## 2013-07-30 DIAGNOSIS — O26899 Other specified pregnancy related conditions, unspecified trimester: Secondary | ICD-10-CM

## 2013-07-30 DIAGNOSIS — O9989 Other specified diseases and conditions complicating pregnancy, childbirth and the puerperium: Secondary | ICD-10-CM

## 2013-07-30 DIAGNOSIS — O2390 Unspecified genitourinary tract infection in pregnancy, unspecified trimester: Secondary | ICD-10-CM | POA: Insufficient documentation

## 2013-07-30 DIAGNOSIS — D259 Leiomyoma of uterus, unspecified: Secondary | ICD-10-CM | POA: Insufficient documentation

## 2013-07-30 HISTORY — DX: Major depressive disorder, single episode, unspecified: F32.9

## 2013-07-30 HISTORY — DX: Essential (primary) hypertension: I10

## 2013-07-30 HISTORY — DX: Depression, unspecified: F32.A

## 2013-07-30 LAB — POCT PREGNANCY, URINE: Preg Test, Ur: POSITIVE — AB

## 2013-07-30 LAB — URINALYSIS, ROUTINE W REFLEX MICROSCOPIC
Glucose, UA: NEGATIVE mg/dL
Hgb urine dipstick: NEGATIVE
Specific Gravity, Urine: 1.025 (ref 1.005–1.030)
pH: 6.5 (ref 5.0–8.0)

## 2013-07-30 LAB — CBC
HCT: 36.2 % (ref 36.0–46.0)
MCHC: 34 g/dL (ref 30.0–36.0)
MCV: 93.5 fL (ref 78.0–100.0)
Platelets: 209 10*3/uL (ref 150–400)
RDW: 12.9 % (ref 11.5–15.5)

## 2013-07-30 LAB — HCG, QUANTITATIVE, PREGNANCY: hCG, Beta Chain, Quant, S: 78 m[IU]/mL — ABNORMAL HIGH (ref <5)

## 2013-07-30 MED ORDER — METRONIDAZOLE 500 MG PO TABS: 500.0000 mg | ORAL_TABLET | Freq: Two times a day (BID) | ORAL | Status: DC

## 2013-07-30 MED ORDER — ACETAMINOPHEN 325 MG PO TABS: 650.0000 mg | ORAL_TABLET | ORAL | Status: AC | PRN

## 2013-07-30 MED ORDER — OXYCODONE-ACETAMINOPHEN 5-325 MG PO TABS: 1.0000 | ORAL_TABLET | Freq: Once | ORAL | Status: DC | PRN

## 2013-07-30 MED ORDER — OXYCODONE-ACETAMINOPHEN 5-325 MG PO TABS: 2.0000 | ORAL_TABLET | Freq: Once | ORAL | Status: DC

## 2013-07-30 MED ORDER — METRONIDAZOLE 500 MG PO TABS: 500.0000 mg | ORAL_TABLET | Freq: Two times a day (BID) | ORAL | Status: AC

## 2013-07-30 NOTE — MAU Note (Signed)
Patient states she has a positive home pregnancy test. States she woke up at 0400 with cramping after intercourse last night. Some nausea, no vomiting. Denies bleeding or discharge.

## 2013-07-30 NOTE — MAU Provider Note (Signed)
History     CSN: [id number]  Arrival date and time: 07/30/13 2130   First Provider Initiated Contact with Patient 07/30/13 1028      Chief Complaint  Patient presents with  . Possible Pregnancy  . Abdominal Pain  . Nausea   HPI Comments: The onset of abdominal pain was 4 am, which awoke the [name]. She report the severity as a 9/10 and qualifies it as menstrual/crampy pain.  She does report nausea, but denies emesis.  Her last intercourse was last evening.  Patinet reports same sexual partner for 1.5 years.  She reports no vaginal discharge, denies blood in the urine, and report inconsistent stools, though she report this as normal.  She is eating and drinking normally.  Menstrual periods are regular.    Possible Pregnancy This is a new problem. The current episode started today. The problem has been unchanged. Associated symptoms include abdominal pain, headaches and nausea. Pertinent negatives include no anorexia, change in bowel habit, coughing, diaphoresis, fever, urinary symptoms or visual change.  Abdominal Pain Associated symptoms include constipation, diarrhea, headaches and nausea. Pertinent negatives include no anorexia, dysuria, fever, frequency or hematuria.    Past Medical History  Diagnosis Date  . Migraine     Imitrex taken in the past  . Abnormal Pap smear 2008;2013    Rpt  paps have been abnl;Last papn 2012 was abnormal  . Anemia     Iron supplements taken in the past;during last pregnancy  . Infection     BV x 3;currently being treated for infection  . Infection     Yeast;not frequent  . IUFD (intrauterine fetal death) 03/23/2013    [redacted]w[redacted]d  . Obese 03/23/13  . Fibroids   . Hypertension   . Depression     PP after lost babies    Past Surgical History  Procedure Laterality Date  . Breast surgery      Non cancerous mass removed @ 12 yoa    Family History  Problem Relation Age of Onset  . Hypertension Mother   . Diabetes Maternal Grandfather   .  Hypertension Maternal Grandfather   . Asthma Sister   . Migraines Mother   . Migraines Sister   . Migraines Brother   . Migraines Maternal Uncle   . Migraines Maternal Grandmother   . Other Maternal Grandmother     Varicose veins    History  Substance Use Topics  . Smoking status: Former Games developer  . Smokeless tobacco: Never Used     Comment: quit with preg  . Alcohol Use: Yes     Comment: occasionally prior to pregnancy    Allergies: No Known Allergies  Prescriptions prior to admission  Medication Sig Dispense Refill  . Prenatal Vit-Fe Fumarate-FA (PRENATAL MULTIVITAMIN) TABS Take 1 tablet by mouth daily at 12 noon.        Review of Systems  Constitutional: Negative.  Negative for fever and diaphoresis.  Eyes: Negative for blurred vision.  Respiratory: Negative.  Negative for cough.   Cardiovascular: Negative.   Gastrointestinal: Positive for nausea, abdominal pain, diarrhea and constipation. Negative for anorexia and change in bowel habit.  Genitourinary: Negative for dysuria, urgency, frequency, hematuria and flank pain.  Skin: Negative.   Neurological: Positive for headaches. Negative for dizziness and sensory change.   Physical Exam   Blood pressure 141/80, pulse 71, temperature 98.5 F (36.9 C), temperature source Oral, resp. rate 16, height 5' 6.5" (1.689 m), weight 111.948 kg (246 lb 12.8 oz),  last menstrual period 06/22/2013, SpO2 100.00%.  Physical Exam  Constitutional: She is oriented to person, place, and time. Vital signs are normal. She appears well-developed and well-nourished.  Non-toxic appearance. She does not have a sickly appearance. She does not appear ill. No distress.  Cardiovascular: Normal rate, regular rhythm and normal heart sounds.  Exam reveals no gallop and no friction rub.   No murmur heard. Respiratory: Effort normal and breath sounds normal. No respiratory distress. She has no wheezes. She has no rales. She exhibits no tenderness.  GI:  Soft. Bowel sounds are normal. She exhibits no distension and no mass. There is no tenderness. There is no rebound and no guarding.  Genitourinary: Pelvic exam was performed with patient supine. There is no rash, tenderness, lesion or injury on the right labia. There is no rash, tenderness, lesion or injury on the left labia. No erythema, tenderness or bleeding around the vagina. No foreign body around the vagina. No signs of injury around the vagina. Vaginal discharge ( Small amount of thin white malodorous discharge.  ) found.  Neurological: She is alert and oriented to person, place, and time.  Skin: Skin is warm and dry. No rash noted. She is not diaphoretic. No erythema.    MAU Course  Procedures  Results for orders placed during the hospital encounter of 07/30/13 (from the past 24 hour(s))  URINALYSIS, ROUTINE W REFLEX MICROSCOPIC     Status: None   Collection Time    07/30/13  9:30 AM      Result Value Range   Color, Urine YELLOW  YELLOW   APPearance CLEAR  CLEAR   Specific Gravity, Urine 1.025  1.005 - 1.030   pH 6.5  5.0 - 8.0   Glucose, UA NEGATIVE  NEGATIVE mg/dL   Hgb urine dipstick NEGATIVE  NEGATIVE   Bilirubin Urine NEGATIVE  NEGATIVE   Ketones, ur NEGATIVE  NEGATIVE mg/dL   Protein, ur NEGATIVE  NEGATIVE mg/dL   Urobilinogen, UA 1.0  0.0 - 1.0 mg/dL   Nitrite NEGATIVE  NEGATIVE   Leukocytes, UA NEGATIVE  NEGATIVE  POCT PREGNANCY, URINE     Status: Abnormal   Collection Time    07/30/13  9:46 AM      Result Value Range   Preg Test, Ur POSITIVE (*) NEGATIVE  HCG, QUANTITATIVE, PREGNANCY     Status: Abnormal   Collection Time    07/30/13 10:30 AM      Result Value Range   hCG, Beta Chain, Quant, S 78 (*) <5 mIU/mL  CBC     Status: None   Collection Time    07/30/13 10:30 AM      Result Value Range   WBC 6.0  4.0 - 10.5 K/uL   RBC 3.87  3.87 - 5.11 MIL/uL   Hemoglobin 12.3  12.0 - 15.0 g/dL   HCT 45.4  09.8 - 11.9 %   MCV 93.5  78.0 - 100.0 fL   MCH 31.8  26.0  - 34.0 pg   MCHC 34.0  30.0 - 36.0 g/dL   RDW 14.7  82.9 - 56.2 %   Platelets 209  150 - 400 K/uL  WET PREP, GENITAL     Status: Abnormal   Collection Time    07/30/13 12:25 PM      Result Value Range   Yeast Wet Prep HPF POC NONE SEEN  NONE SEEN   Trich, Wet Prep NONE SEEN  NONE SEEN   Clue Cells Wet Prep HPF  POC MODERATE (*) NONE SEEN   WBC, Wet Prep HPF POC FEW (*) NONE SEEN        Assessment and Plan  A: Differential DDx -Uterine fibroid pain etiology -Spontaneous abortion -Bacterial Vaginosis -Ectopic    P: -Discharge to home -Repeat beta hCG in 48 hours -Pain management with ibuprofen -Tylenol 1-2 tabs PO q4-6 hours prn -Treat bacterial vaginosis with Metronidazole 500mg  PO q12h x 7 days  CLARK, MICHAEL L 07/30/2013, 1:12 PM   I was present for the exam and agree with above.  [place], [place] 07/30/2013 8:03 PM

## 2013-07-30 NOTE — MAU Note (Signed)
Been having pain, "little period like cramps"- got worse about 0430 this morning.  No bleeding.  +HPT couple days ago.

## 2013-07-31 LAB — GC/CHLAMYDIA PROBE AMP: GC Probe RNA: NEGATIVE

## 2013-08-01 ENCOUNTER — Inpatient Hospital Stay (HOSPITAL_COMMUNITY)
Admission: AD | Admit: 2013-08-01 | Discharge: 2013-08-02 | Disposition: A | Discharge status: Home / Self Care | Payer: Medicaid Other | Source: Ambulatory Visit | Attending: Obstetrics & Gynecology | Admitting: Obstetrics & Gynecology

## 2013-08-01 DIAGNOSIS — O039 Complete or unspecified spontaneous abortion without complication: Secondary | ICD-10-CM | POA: Insufficient documentation

## 2013-08-01 NOTE — MAU Provider Note (Signed)
Attestation of Attending Supervision of Advanced Practitioner (CNM/NP): Evaluation and management procedures were performed by the Advanced Practitioner under my supervision and collaboration.  I have reviewed the Advanced Practitioner's note and chart, and I agree with the management and plan.  Saket Hellstrom 08/01/2013 10:26 AM   

## 2013-08-02 ENCOUNTER — Encounter (HOSPITAL_COMMUNITY): Payer: Self-pay | Admitting: *Deleted

## 2013-08-02 DIAGNOSIS — O039 Complete or unspecified spontaneous abortion without complication: Secondary | ICD-10-CM

## 2013-08-02 LAB — CBC
HCT: 34.7 % — ABNORMAL LOW (ref 36.0–46.0)
Hemoglobin: 12 g/dL (ref 12.0–15.0)
RBC: 3.7 MIL/uL — ABNORMAL LOW (ref 3.87–5.11)

## 2013-08-02 LAB — HCG, QUANTITATIVE, PREGNANCY: hCG, Beta Chain, Quant, S: 59 m[IU]/mL — ABNORMAL HIGH (ref <5)

## 2013-08-02 MED ORDER — IBUPROFEN 600 MG PO TABS: 600.0000 mg | ORAL_TABLET | Freq: Four times a day (QID) | ORAL | Status: AC | PRN

## 2013-08-02 MED ORDER — IBUPROFEN 600 MG PO TABS
600.0000 mg | ORAL_TABLET | Freq: Once | ORAL | Status: AC
  Administered 2013-08-02: 600 mg via ORAL
  Filled 2013-08-02: qty 1

## 2013-08-02 NOTE — MAU Provider Note (Signed)
History   Chief Complaint:  Follow-up and Vaginal Bleeding   Sarah Oconnor is  27 y.o. G3P1101 Patient's last menstrual period was 06/22/2013.Marland Kitchen Patient is here for follow up of quantitative HCG and ongoing surveillance of pregnancy status.   She is [redacted]w[redacted]d weeks gestation  by LMP.    Since her last visit, the patient is with new complaint.   The patient reports bleeding as  similar to period before coming to MAU. Scant bleeding in MAU.   General ROS:  Otherwise neg.  Her previous Quantitative HCG values are:  07/30/13 78  Physical Exam   Blood pressure 117/72, pulse 60, temperature 98.4 F (36.9 C), temperature source Oral, resp. rate 20, height 5' 6.5" (1.689 m), weight 113.399 kg (250 lb), last menstrual period 06/22/2013, SpO2 99.00%.  Focused Gynecological Exam: examination not indicated. Done at last visit.   Labs: Results for orders placed during the hospital encounter of 08/01/13 (from the past 24 hour(s))  HCG, QUANTITATIVE, PREGNANCY   Collection Time    08/02/13  1:33 AM      Result Value Range   hCG, Beta Chain, Quant, S 59 (*) <5 mIU/mL  CBC   Collection Time    08/02/13  1:33 AM      Result Value Range   WBC 7.3  4.0 - 10.5 K/uL   RBC 3.70 (*) 3.87 - 5.11 MIL/uL   Hemoglobin 12.0  12.0 - 15.0 g/dL   HCT 69.6 (*) 29.5 - 28.4 %   MCV 93.8  78.0 - 100.0 fL   MCH 32.4  26.0 - 34.0 pg   MCHC 34.6  30.0 - 36.0 g/dL   RDW 13.2  44.0 - 10.2 %   Platelets 214  150 - 400 K/uL    Ultrasound Studies:   NA  Assessment: 1. Miscarriage -IUP not confirmed.    Plan: D/C home in stable condition. Bleeding, pain , fever precautions. Follow-up Information   Follow up with Gynecologist. (weekly for bloodwork and in 2 weeks for miscarriage follow -up visit.)       Follow up with [facility] AD[facility]. (As needed in emergencies)    Contact information:   [address] [id number] [place] [address] [phone]        Medication List         acetaminophen 325 MG tablet  Commonly known as:  TYLENOL  Take 2 tablets (650 mg total) by mouth every 4 (four) hours as needed for pain.     ibuprofen 600 MG tablet  Commonly known as:  ADVIL,MOTRIN  Take 1 tablet (600 mg total) by mouth every 6 (six) hours as needed for pain.     metroNIDAZOLE 500 MG tablet  Commonly known as:  FLAGYL  Take 1 tablet (500 mg total) by mouth 2 (two) times daily.     prenatal multivitamin Tabs tablet  Take 1 tablet by mouth daily at 12 noon.        Caryl Fate 08/02/2013, 1:34 AM

## 2013-08-02 NOTE — MAU Note (Signed)
Pt reports cramping has continued, also began bleeding today. Was scheduled to come back today for repeat lab work.

## 2013-08-28 ENCOUNTER — Emergency Department: Payer: Self-pay | Admitting: Emergency Medicine

## 2013-08-28 LAB — URINALYSIS, COMPLETE
Glucose,UR: NEGATIVE mg/dL (ref 0–75)
Ketone: NEGATIVE
Nitrite: NEGATIVE

## 2013-08-28 LAB — CBC
MCHC: 34.7 g/dL (ref 32.0–36.0)
Platelet: 196 10*3/uL (ref 150–440)

## 2013-08-30 ENCOUNTER — Emergency Department: Payer: Self-pay | Admitting: Internal Medicine

## 2013-08-30 LAB — CBC WITH DIFFERENTIAL/PLATELET
HCT: 39.5 % (ref 35.0–47.0)
MCHC: 34.7 g/dL (ref 32.0–36.0)
MCV: 97 fL (ref 80–100)
Monocyte %: 6.9 %
Neutrophil #: 3.6 10*3/uL (ref 1.4–6.5)
Neutrophil %: 59.2 %
Platelet: 200 10*3/uL (ref 150–440)
RBC: 4.09 10*6/uL (ref 3.80–5.20)
RDW: 13.4 % (ref 11.5–14.5)

## 2013-08-30 LAB — GC/CHLAMYDIA PROBE AMP

## 2013-08-30 LAB — WET PREP, GENITAL

## 2014-03-19 IMAGING — CR DG CHEST 2V
2 series · 2 of 2 positions shown · non-contrast
Comparison: None.

CLINICAL DATA: Shortness of breath.  3 months pregnant.

CHEST - 2 VIEW

[w chest pa]
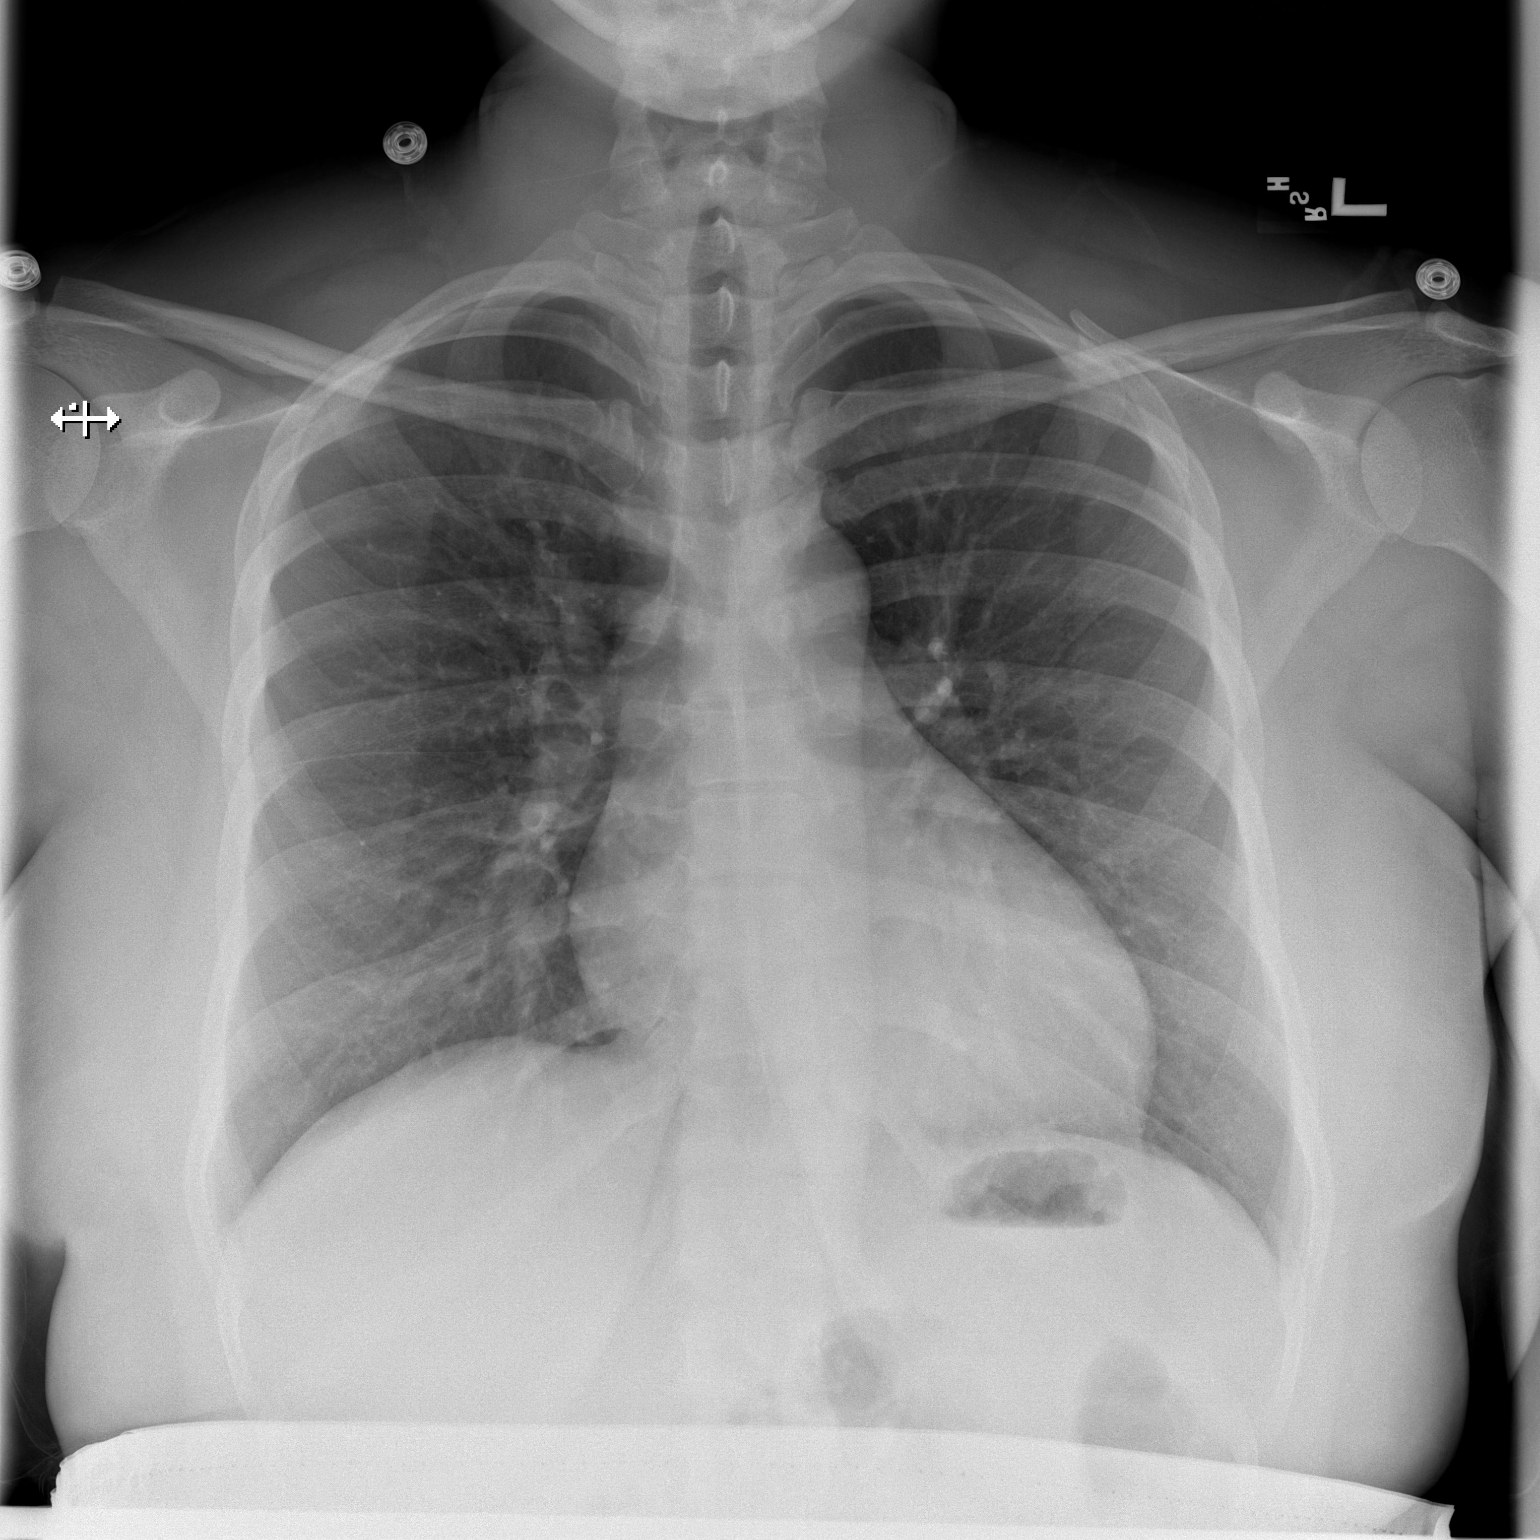

[w chest lat]
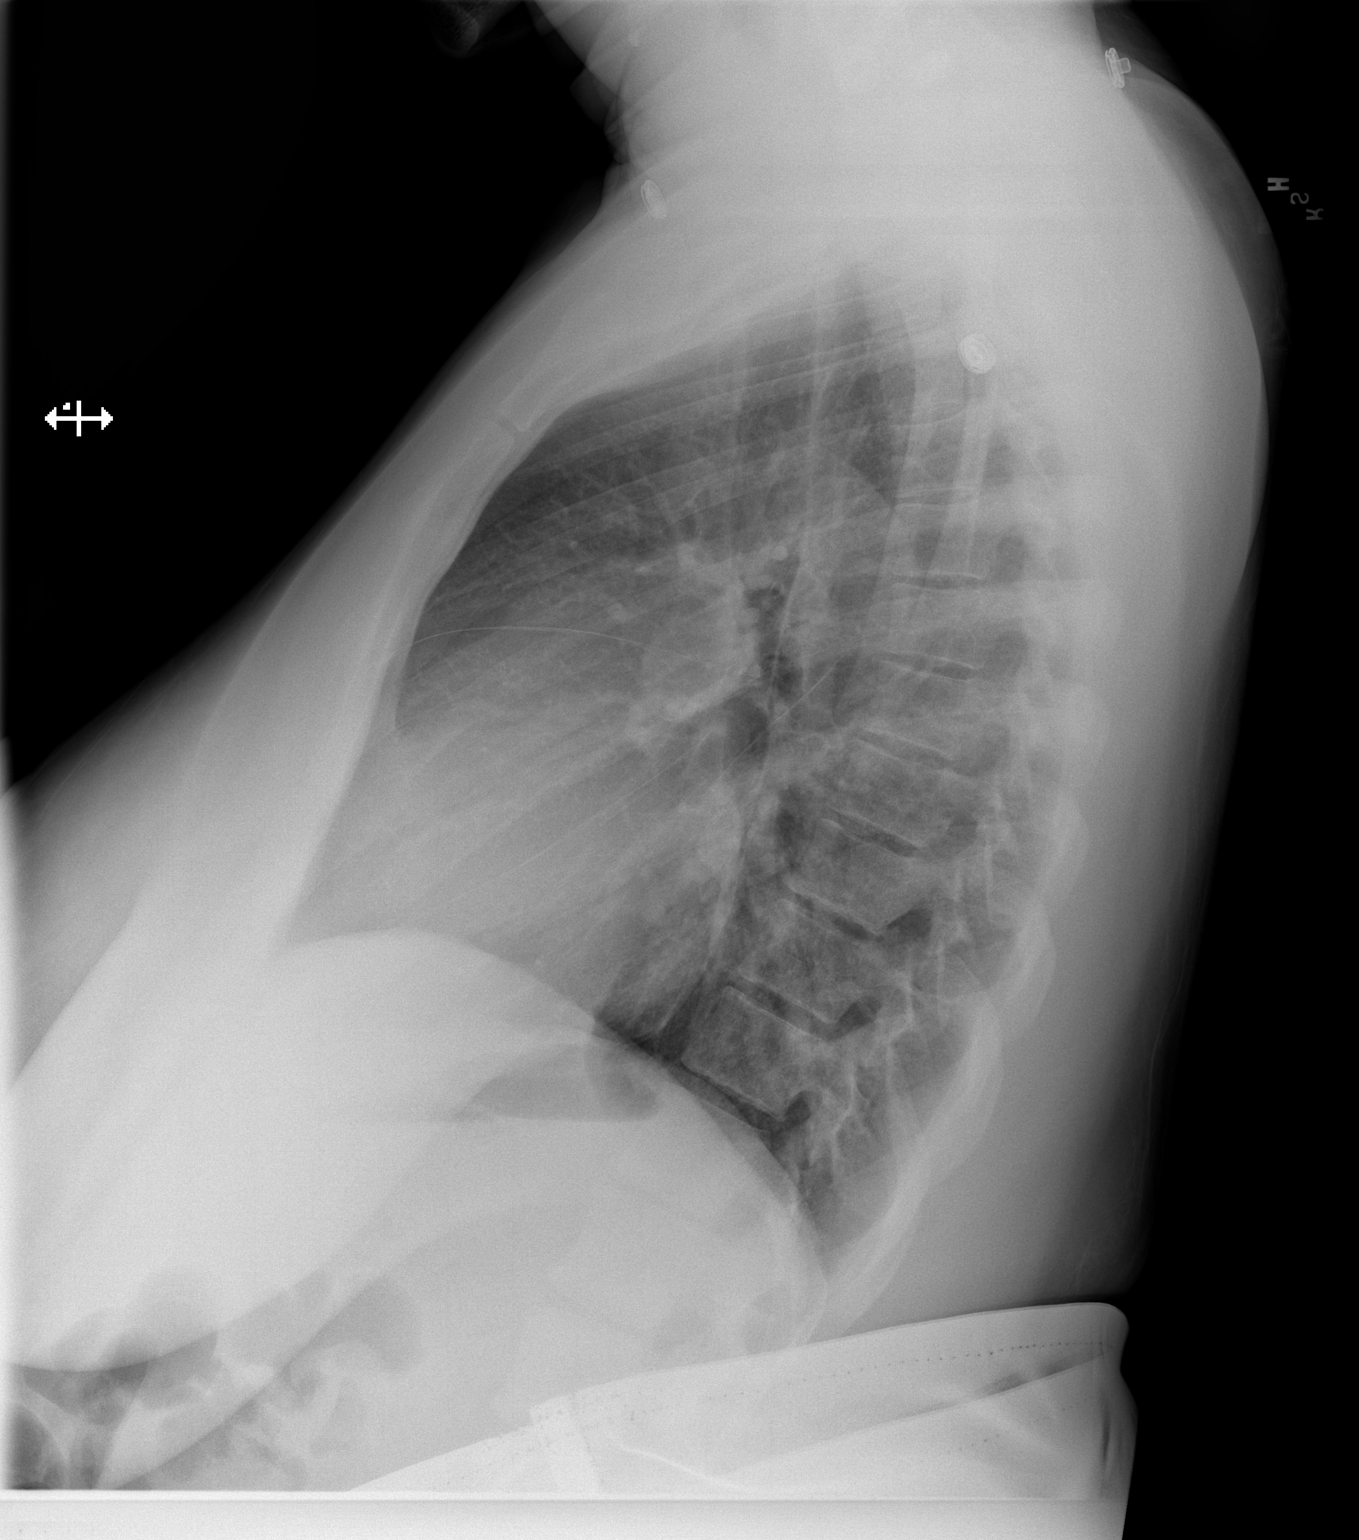

[2 of 2 positions shown; findings below may reference images not displayed]

FINDINGS: The heart and mediastinal contours are within normal
limits. The lungs are well expanded and clear. No airspace disease,
pleural abnormality, or pneumothorax is identified. The visualized
bony thorax and upper abdomen are unremarkable.
IMPRESSION: No acute cardiopulmonary disease.

## 2014-06-04 ENCOUNTER — Encounter (HOSPITAL_COMMUNITY): Payer: Self-pay | Admitting: *Deleted

## 2014-10-28 ENCOUNTER — Encounter (HOSPITAL_COMMUNITY): Payer: Self-pay | Admitting: *Deleted

## 2016-07-26 ENCOUNTER — Emergency Department: Payer: Medicaid Other

## 2016-07-26 ENCOUNTER — Emergency Department
Admission: EM | Admit: 2016-07-26 | Discharge: 2016-07-26 | Disposition: A | Discharge status: Home / Self Care | Payer: Medicaid Other | Attending: Emergency Medicine | Admitting: Emergency Medicine

## 2016-07-26 DIAGNOSIS — O039 Complete or unspecified spontaneous abortion without complication: Secondary | ICD-10-CM

## 2016-07-26 DIAGNOSIS — N939 Abnormal uterine and vaginal bleeding, unspecified: Secondary | ICD-10-CM | POA: Diagnosis present

## 2016-07-26 DIAGNOSIS — R102 Pelvic and perineal pain: Secondary | ICD-10-CM | POA: Insufficient documentation

## 2016-07-26 DIAGNOSIS — Z87891 Personal history of nicotine dependence: Secondary | ICD-10-CM | POA: Diagnosis not present

## 2016-07-26 DIAGNOSIS — I10 Essential (primary) hypertension: Secondary | ICD-10-CM | POA: Diagnosis not present

## 2016-07-26 LAB — CBC WITH DIFFERENTIAL/PLATELET
Basophils Absolute: 0 10*3/uL (ref 0–0.1)
Basophils Relative: 1 %
EOS PCT: 1 %
Eosinophils Absolute: 0.1 10*3/uL (ref 0–0.7)
HCT: 36.3 % (ref 35.0–47.0)
Hemoglobin: 12.7 g/dL (ref 12.0–16.0)
LYMPHS ABS: 2 10*3/uL (ref 1.0–3.6)
LYMPHS PCT: 31 %
MCH: 33.8 pg (ref 26.0–34.0)
MCHC: 35 g/dL (ref 32.0–36.0)
MCV: 96.7 fL (ref 80.0–100.0)
MONO ABS: 0.6 10*3/uL (ref 0.2–0.9)
MONOS PCT: 8 %
Neutro Abs: 3.9 10*3/uL (ref 1.4–6.5)
Neutrophils Relative %: 59 %
PLATELETS: 179 10*3/uL (ref 150–440)
RBC: 3.75 MIL/uL — ABNORMAL LOW (ref 3.80–5.20)
RDW: 13.1 % (ref 11.5–14.5)
WBC: 6.6 10*3/uL (ref 3.6–11.0)

## 2016-07-26 LAB — BASIC METABOLIC PANEL
Anion gap: 8 (ref 5–15)
BUN: 11 mg/dL (ref 6–20)
CALCIUM: 8.9 mg/dL (ref 8.9–10.3)
CO2: 24 mmol/L (ref 22–32)
Chloride: 106 mmol/L (ref 101–111)
Creatinine, Ser: 0.79 mg/dL (ref 0.44–1.00)
GFR calc Af Amer: >60 mL/min (ref >60)
GLUCOSE: 107 mg/dL — AB (ref 65–99)
Potassium: 3.2 mmol/L — ABNORMAL LOW (ref 3.5–5.1)
Sodium: 138 mmol/L (ref 135–145)

## 2016-07-26 LAB — ABO/RH: ABO/RH(D): O POS

## 2016-07-26 LAB — HCG, QUANTITATIVE, PREGNANCY: hCG, Beta Chain, Quant, S: 2569 m[IU]/mL — ABNORMAL HIGH (ref <5)

## 2016-07-26 NOTE — ED Notes (Signed)
Pt returned from US

## 2016-07-26 NOTE — Discharge Instructions (Signed)
As we discussed, it appears that you have completed or nearly completed your miscarriage (spontaneous abortion).  On your pelvic exam today you had a blood clot within the cervix which we removed and your bleeding has lessened significantly.  Your ultrasound demonstrated a persistently thickened endometrium but with no obvious fetal products in your uterus.  We recommend that you follow-up with your OB doctor at the next available opportunity for a follow-up appointment.  Return to the nearest emergency department if you develop any new or worsening symptoms such as worsening pain, fever, or other symptoms that concern you.

## 2016-07-26 NOTE — ED Provider Notes (Signed)
[facility] Emergency Department Provider Note  ____________________________________________   First MD Initiated Contact with Patient 07/26/16 1740     (approximate)  I have reviewed the triage vital signs and the nursing notes.   HISTORY  Chief Complaint Vaginal Bleeding    HPI Sarah Oconnor is a 30 y.o. female G3 P1 at approximately [redacted] weeks gestation with one prior stillbirth who presents for evaluation of acute onset vaginal bleeding.  She goes to an OB at [facility] and reports that she was told about a week ago that she should anticipate an inevitable spontaneous abortion.  She had some mild intermittent pelvic cramping and spotting but no significant bleeding.  Acutely today she had moderate to severe lower abdominal cramps and she was in the process of drivingaway from work when she passed a large amount of blood and fetal products.  She said that the cramping was severe but it is improved to just mild intermittent cramps at this time.  She is continuing to have a small amount of "leaking" of dark blood but not actively bleeding.  She denies dizziness, lightheadedness, fever/chills, chest pain, shortness of breath, nausea, vomiting, diarrhea.   Past Medical History:  Diagnosis Date  . Abnormal Pap smear 2008;2013   Rpt  paps have been abnl;Last papn 2012 was abnormal  . Anemia    Iron supplements taken in the past;during last pregnancy  . Depression    PP after lost babies  . Fibroids   . Hypertension   . Infection    BV x 3;currently being treated for infection  . Infection    Yeast;not frequent  . IUFD (intrauterine fetal death) 2013-03-26   [redacted]w[redacted]d  . Migraine    Imitrex taken in the past  . Obese 26-Mar-2013    Patient Active Problem List   Diagnosis Date Noted  . Vaginal delivery 03/22/2013  . IUFD (intrauterine fetal death) 26-Mar-2013  . Obese March 26, 2013    Past Surgical History:  Procedure Laterality Date  . BREAST SURGERY     Non  cancerous mass removed @ 1 yoa    Prior to Admission medications   Medication Sig Start Date End Date Taking? Authorizing Provider  ibuprofen (ADVIL,MOTRIN) 600 MG tablet Take 1 tablet (600 mg total) by mouth every 6 (six) hours as needed for pain. 08/02/13   Manya Silvas, CNM  metroNIDAZOLE (FLAGYL) 500 MG tablet Take 1 tablet (500 mg total) by mouth 2 (two) times daily. 07/30/13   Manya Silvas, CNM  Prenatal Vit-Fe Fumarate-FA (PRENATAL MULTIVITAMIN) TABS Take 1 tablet by mouth daily at 12 noon.    Historical Provider, MD    Allergies Review of patient's allergies indicates no known allergies.  Family History  Problem Relation Age of Onset  . Hypertension Mother   . Migraines Mother   . Diabetes Maternal Grandfather   . Hypertension Maternal Grandfather   . Asthma Sister   . Migraines Sister   . Migraines Brother   . Migraines Maternal Uncle   . Migraines Maternal Grandmother   . Other Maternal Grandmother     Varicose veins    Social History Social History  Substance Use Topics  . Smoking status: Former Research scientist (life sciences)  . Smokeless tobacco: Never Used     Comment: quit with preg  . Alcohol use Yes     Comment: occasionally prior to pregnancy    Review of Systems Constitutional: No fever/chills Eyes: No visual changes. ENT: No sore throat. Cardiovascular: Denies chest pain. Respiratory:  Denies shortness of breath. Gastrointestinal: No abdominal pain.  No nausea, no vomiting.  No diarrhea.  No constipation. Genitourinary: acute onset vaginal bleeding and passage of presumed products Musculoskeletal: Negative for back pain. Skin: Negative for rash. Neurological: Negative for headaches, focal weakness or numbness.  10-point ROS otherwise negative.  ____________________________________________   PHYSICAL EXAM:  VITAL SIGNS: ED Triage Vitals  Enc Vitals Group     BP 07/26/16 1616 (!) 143/86     Pulse Rate 07/26/16 1616 80     Resp 07/26/16 1616 18     Temp 07/26/16  1616 98.2 F (36.8 C)     Temp Source 07/26/16 1616 Oral     SpO2 07/26/16 1616 100 %     Weight 07/26/16 1616 225 lb (102.1 kg)     Height 07/26/16 1616 5\' 7"  (1.702 m)     Head Circumference --      Peak Flow --      Pain Score 07/26/16 1617 8     Pain Loc --      Pain Edu? --      Excl. in [place]? --     Constitutional: Alert and oriented. Well appearing and in no acute distress. Eyes: Conjunctivae are normal. PERRL. EOMI. Head: Atraumatic. Nose: No congestion/rhinnorhea. Mouth/Throat: Mucous membranes are moist.  Oropharynx non-erythematous. Neck: No stridor.  No meningeal signs.   Cardiovascular: Normal rate, regular rhythm. Good peripheral circulation. Grossly normal heart sounds.   Respiratory: Normal respiratory effort.  No retractions. Lungs CTAB. Gastrointestinal: Soft and nontender. No distention.  Genitourinary: No external exam, moderate amount of dark blood and clots in her vaginal vault.  Cervix is open and there was a clot in the cervix which I removed with ring forceps.  Small amount of dark blood oozing from the cervix after the clot removal.  No products of conception visualized. Musculoskeletal: No lower extremity tenderness nor edema. No gross deformities of extremities. Neurologic:  Normal speech and language. No gross focal neurologic deficits are appreciated.  Skin:  Skin is warm, dry and intact. No rash noted. Psychiatric: Mood and affect are normal. Speech and behavior are normal.  ____________________________________________   LABS (all labs ordered are listed, but only abnormal results are displayed)  Labs Reviewed  HCG, QUANTITATIVE, PREGNANCY - Abnormal; Notable for the following:       Result Value   hCG, Beta Chain, Quant, S 2,569 (*)    All other components within normal limits  CBC WITH DIFFERENTIAL/PLATELET - Abnormal; Notable for the following:    RBC 3.75 (*)    All other components within normal limits  BASIC METABOLIC PANEL - Abnormal;  Notable for the following:    Potassium 3.2 (*)    Glucose, Bld 107 (*)    All other components within normal limits  ABO/RH   ____________________________________________  EKG  None ____________________________________________  RADIOLOGY   US Ob Comp Less 14 Wks  Result Date: 07/26/2016 CLINICAL DATA:  30 year old female with positive HCG presenting with pelvic cramping. Patient has spontaneous abortion of pregnancy. Evaluate for retained products. EXAM: OBSTETRIC <14 WK [place] AND TRANSVAGINAL OB US TECHNIQUE: Both transabdominal and transvaginal ultrasound examinations were performed for complete evaluation of the gestation as well as the maternal uterus, adnexal regions, and pelvic cul-de-sac. Transvaginal technique was performed to assess early pregnancy. COMPARISON:  None. FINDINGS: The uterus is anteverted and heterogeneous. No intrauterine pregnancy identified. Indication is thickened and echogenic measuring approximately 1.9 cm in thickness. There is no hyperemia or  abnormal endometrial flow. Please note in the absence of documented intrauterine pregnancy on ultrasound and a positive HCG levels the possibility of an ectopic pregnancy is not entirely excluded. Correlation with clinical exam and follow-up with serial HCG levels and ultrasound recommended. There multiple uterine fibroids measuring 1.5 x 1.5 x 2.0 cm in the right frontal region, 2.1 x 2.1 x 2.2 cm in the right fundal region, and 3 3.1 x 2.5 and 3.1 cm in the left fundal region. The right ovary appears unremarkable. There is a 2.2 x 2.0 x 2.0 cm probable corpus luteum in the left ovary. Small free fluid within the pelvis. IMPRESSION: No intrauterine pregnancy identified. Correlation with clinical exam and follow-up with serial HCG levels and ultrasound recommended. Thickened endometrium.  No hyperemia or abnormal vascularity. Multiple uterine fibroids. Probable left ovarian corpus luteum. Electronically Signed   By: Anner Crete M.D.   On: 07/26/2016 21:18   US Ob Transvaginal  Result Date: 07/26/2016 CLINICAL DATA:  30 year old female with positive HCG presenting with pelvic cramping. Patient has spontaneous abortion of pregnancy. Evaluate for retained products. EXAM: OBSTETRIC <14 WK [place] AND TRANSVAGINAL OB US TECHNIQUE: Both transabdominal and transvaginal ultrasound examinations were performed for complete evaluation of the gestation as well as the maternal uterus, adnexal regions, and pelvic cul-de-sac. Transvaginal technique was performed to assess early pregnancy. COMPARISON:  None. FINDINGS: The uterus is anteverted and heterogeneous. No intrauterine pregnancy identified. Indication is thickened and echogenic measuring approximately 1.9 cm in thickness. There is no hyperemia or abnormal endometrial flow. Please note in the absence of documented intrauterine pregnancy on ultrasound and a positive HCG levels the possibility of an ectopic pregnancy is not entirely excluded. Correlation with clinical exam and follow-up with serial HCG levels and ultrasound recommended. There multiple uterine fibroids measuring 1.5 x 1.5 x 2.0 cm in the right frontal region, 2.1 x 2.1 x 2.2 cm in the right fundal region, and 3 3.1 x 2.5 and 3.1 cm in the left fundal region. The right ovary appears unremarkable. There is a 2.2 x 2.0 x 2.0 cm probable corpus luteum in the left ovary. Small free fluid within the pelvis. IMPRESSION: No intrauterine pregnancy identified. Correlation with clinical exam and follow-up with serial HCG levels and ultrasound recommended. Thickened endometrium.  No hyperemia or abnormal vascularity. Multiple uterine fibroids. Probable left ovarian corpus luteum. Electronically Signed   By: Anner Crete M.D.   On: 07/26/2016 21:18    ____________________________________________   PROCEDURES  Procedure(s) performed:   Procedures   ____________________________________________   INITIAL IMPRESSION /  ASSESSMENT AND PLAN / ED COURSE  Pertinent labs & imaging results that were available during my care of the patient were reviewed by me and considered in my medical decision making (see chart for details).  After performing the pelvic exam I ordered a transvaginal ultrasound to evaluate for possible retained products.  Although her endometrium is thickened and there does not seem to be any evidence of fetus or fetal products in the uterus.  The patient has been stable for hours in the emergency department with very minimal if any vaginal bleeding.  I gave my usual and customary follow-up limitations and return precautions.  The patient agrees with the plan to call her OB doctor in [facility] tomorrow for the next available appointment.  She is Rh+ and does not need RhoGAM.  Clinical Course    ____________________________________________  FINAL CLINICAL IMPRESSION(S) / ED DIAGNOSES  Final diagnoses:  Spontaneous abortion in first  trimester     MEDICATIONS GIVEN DURING THIS VISIT:  Medications - No data to display   NEW OUTPATIENT MEDICATIONS STARTED DURING THIS VISIT:  New Prescriptions   No medications on file      Note:  This document was prepared using Dragon voice recognition software and may include unintentional dictation errors.    Hinda Kehr, MD 07/26/16 2208

## 2016-07-26 NOTE — ED Triage Notes (Signed)
Pt reports to ED w/ c/ovaginal bleeding.  Pt sts that she went to [facility] approx 1 week ago, and told that she was going to have miscarriage.  Pt sts spotting began next day.  Pt has not taken any medications for miscarriage.  Pt sts that she was driving today "and it all came out".  C/O cramping 8/10.  No LOC, SOB, CP, n/v/d or dizziness. NAD

## 2016-12-10 ENCOUNTER — Emergency Department: Payer: Medicaid Other

## 2016-12-10 ENCOUNTER — Encounter: Payer: Self-pay | Admitting: Urgent Care

## 2016-12-10 ENCOUNTER — Emergency Department
Admission: EM | Admit: 2016-12-10 | Discharge: 2016-12-10 | Disposition: A | Discharge status: Home / Self Care | Payer: Medicaid Other | Attending: Emergency Medicine | Admitting: Emergency Medicine

## 2016-12-10 DIAGNOSIS — I10 Essential (primary) hypertension: Secondary | ICD-10-CM | POA: Insufficient documentation

## 2016-12-10 DIAGNOSIS — S93402A Sprain of unspecified ligament of left ankle, initial encounter: Secondary | ICD-10-CM | POA: Diagnosis not present

## 2016-12-10 DIAGNOSIS — Z87891 Personal history of nicotine dependence: Secondary | ICD-10-CM | POA: Insufficient documentation

## 2016-12-10 DIAGNOSIS — X501XXA Overexertion from prolonged static or awkward postures, initial encounter: Secondary | ICD-10-CM | POA: Diagnosis not present

## 2016-12-10 DIAGNOSIS — Y929 Unspecified place or not applicable: Secondary | ICD-10-CM | POA: Insufficient documentation

## 2016-12-10 DIAGNOSIS — Y999 Unspecified external cause status: Secondary | ICD-10-CM | POA: Diagnosis not present

## 2016-12-10 DIAGNOSIS — Y939 Activity, unspecified: Secondary | ICD-10-CM | POA: Diagnosis not present

## 2016-12-10 DIAGNOSIS — S99912A Unspecified injury of left ankle, initial encounter: Secondary | ICD-10-CM | POA: Diagnosis present

## 2016-12-10 MED ORDER — KETOROLAC TROMETHAMINE 10 MG PO TABS
ORAL_TABLET | ORAL | Status: AC
  Administered 2016-12-10: 10 mg via ORAL
  Filled 2016-12-10: qty 1

## 2016-12-10 MED ORDER — OXYCODONE-ACETAMINOPHEN 5-325 MG PO TABS
1.0000 | ORAL_TABLET | Freq: Once | ORAL | Status: AC
  Administered 2016-12-10: 1 via ORAL
  Filled 2016-12-10: qty 1

## 2016-12-10 MED ORDER — LIDOCAINE 5 % EX PTCH
1.0000 | MEDICATED_PATCH | CUTANEOUS | Status: DC
  Administered 2016-12-10: 1 via TRANSDERMAL

## 2016-12-10 MED ORDER — LIDOCAINE 5 % EX PTCH
MEDICATED_PATCH | CUTANEOUS | Status: AC
  Administered 2016-12-10: 1 via TRANSDERMAL
  Filled 2016-12-10: qty 1

## 2016-12-10 MED ORDER — OXYCODONE-ACETAMINOPHEN 5-325 MG PO TABS: 1.0000 | ORAL_TABLET | ORAL | 0 refills | Status: AC | PRN

## 2016-12-10 MED ORDER — KETOROLAC TROMETHAMINE 10 MG PO TABS
10.0000 mg | ORAL_TABLET | Freq: Once | ORAL | Status: AC
  Administered 2016-12-10: 10 mg via ORAL

## 2016-12-10 NOTE — ED Provider Notes (Signed)
[facility] Emergency Department Provider Note    First MD Initiated Contact with Patient 12/10/16 0543     (approximate)  I have reviewed the triage vital signs and the nursing notes.   HISTORY  Chief Complaint Ankle Pain    HPI Sarah Oconnor is a 30 y.o. female resents with 10 out of 10 left ankle pain after "missing a step with resultant twisting of her left ankle. Patient states that she tried icing the ankle at home however unable to bear weight pain not improved. She admits to progressive swelling and discomfort since the time of the injury this morning.   Past Medical History:  Diagnosis Date  . Abnormal Pap smear 2008;2013   Rpt  paps have been abnl;Last papn 2012 was abnormal  . Anemia    Iron supplements taken in the past;during last pregnancy  . Depression    PP after lost babies  . Fibroids   . Hypertension   . Infection    BV x 3;currently being treated for infection  . Infection    Yeast;not frequent  . IUFD (intrauterine fetal death) 04-18-2013   [redacted]w[redacted]d  . Migraine    Imitrex taken in the past  . Obese April 18, 2013    Patient Active Problem List   Diagnosis Date Noted  . Vaginal delivery 03/22/2013  . IUFD (intrauterine fetal death) 04-18-2013  . Obese 04-18-13    Past Surgical History:  Procedure Laterality Date  . BREAST SURGERY     Non cancerous mass removed @ 71 yoa    Prior to Admission medications   Medication Sig Start Date End Date Taking? Authorizing Provider  ibuprofen (ADVIL,MOTRIN) 600 MG tablet Take 1 tablet (600 mg total) by mouth every 6 (six) hours as needed for pain. 08/02/13   Manya Silvas, CNM  metroNIDAZOLE (FLAGYL) 500 MG tablet Take 1 tablet (500 mg total) by mouth 2 (two) times daily. 07/30/13   Manya Silvas, CNM  oxyCODONE-acetaminophen (ROXICET) 5-325 MG tablet Take 1 tablet by mouth every 4 (four) hours as needed for severe pain. 12/10/16   Gregor Hams, MD  Prenatal Vit-Fe Fumarate-FA  (PRENATAL MULTIVITAMIN) TABS Take 1 tablet by mouth daily at 12 noon.    Historical Provider, MD    Allergies Patient has no known allergies.  Family History  Problem Relation Age of Onset  . Hypertension Mother   . Migraines Mother   . Diabetes Maternal Grandfather   . Hypertension Maternal Grandfather   . Asthma Sister   . Migraines Sister   . Migraines Brother   . Migraines Maternal Uncle   . Migraines Maternal Grandmother   . Other Maternal Grandmother     Varicose veins    Social History Social History  Substance Use Topics  . Smoking status: Former Research scientist (life sciences)  . Smokeless tobacco: Never Used     Comment: quit with preg  . Alcohol use Yes     Comment: occasionally prior to pregnancy    Review of Systems Constitutional: No fever/chills Eyes: No visual changes. ENT: No sore throat. Cardiovascular: Denies chest pain. Respiratory: Denies shortness of breath. Gastrointestinal: No abdominal pain.  No nausea, no vomiting.  No diarrhea.  No constipation. Genitourinary: Negative for dysuria. Musculoskeletal: Negative for back pain.Positive for left ankle pain and swelling. Pain with active and passive range of motion. Skin: Negative for rash. Neurological: Negative for headaches, focal weakness or numbness.  10-point ROS otherwise negative.  ____________________________________________   PHYSICAL EXAM:  VITAL SIGNS:  ED Triage Vitals  Enc Vitals Group     BP 12/10/16 0538 134/87     Pulse Rate 12/10/16 0538 91     Resp 12/10/16 0538 16     Temp 12/10/16 0538 97.8 F (36.6 C)     Temp Source 12/10/16 0538 Oral     SpO2 12/10/16 0538 98 %     Weight 12/10/16 0539 225 lb (102.1 kg)     Height 12/10/16 0539 5\' 7"  (1.702 m)     Head Circumference --      Peak Flow --      Pain Score 12/10/16 0539 10     Pain Loc --      Pain Edu? --      Excl. in [place]? --     Constitutional: Alert and oriented. Well appearing and in no acute distress. Eyes: Conjunctivae are  normal. PERRL. EOMI. Head: Atraumatic. Musculoskeletal: Left lateral malleoli pain and swelling. Pain with passive and active range of motion.  Neurologic:  Normal speech and language. No gross focal neurologic deficits are appreciated.  Skin:  Skin is warm, dry and intact. No rash noted.    I, Hillman, personally viewed and evaluated these images (plain radiographs) as part of my medical decision making, as well as reviewing the written report by the radiologist.  Dg Ankle Complete Left  Result Date: 12/10/2016 CLINICAL DATA:  30 y/o  F; pain and swelling after inversion injury. EXAM: LEFT ANKLE COMPLETE - 3+ VIEW COMPARISON:  None. FINDINGS: No acute fracture or dislocation. Talar dome is intact. Ankle mortise is symmetric. Small plantar calcaneal spur. Mild soft tissue swelling about the ankle joint. IMPRESSION: No acute fracture or dislocation identified. Mild soft tissue swelling about the ankle joint. Electronically Signed   By: Kristine Garbe M.D.   On: 12/10/2016 06:15     Procedures     INITIAL IMPRESSION / ASSESSMENT AND PLAN / ED COURSE  Pertinent labs & imaging results that were available during my care of the patient were reviewed by me and considered in my medical decision making (see chart for details).  Lidoderm patch applied as well as an icepack Velcro splint and crutches applied.   Clinical Course     ____________________________________________  FINAL CLINICAL IMPRESSION(S) / ED DIAGNOSES  Final diagnoses:  Sprain of left ankle, unspecified ligament, initial encounter     MEDICATIONS GIVEN DURING THIS VISIT:  Medications  lidocaine (LIDODERM) 5 % 1 patch (1 patch Transdermal Patch Applied 12/10/16 0608)  oxyCODONE-acetaminophen (PERCOCET/ROXICET) 5-325 MG per tablet 1 tablet (not administered)  ketorolac (TORADOL) tablet 10 mg (10 mg Oral Given 12/10/16 0605)     NEW OUTPATIENT MEDICATIONS STARTED DURING THIS VISIT:  New  Prescriptions   OXYCODONE-ACETAMINOPHEN (ROXICET) 5-325 MG TABLET    Take 1 tablet by mouth every 4 (four) hours as needed for severe pain.    Modified Medications   No medications on file    Discontinued Medications   No medications on file     Note:  This document was prepared using Dragon voice recognition software and may include unintentional dictation errors.    Gregor Hams, MD 12/10/16 [phone]

## 2016-12-10 NOTE — ED Triage Notes (Signed)
Patient presents with c/o LEFT ankle pain. Patient reports that she missed a step and twisted her ankle. Tried to treat with RICE at home, however difficult to bear weight. Patient reports that she fell for a second time this morning. Ankle painful and swollen; (+) PMS; foot warm and dry; cap refill WNL.

## 2017-03-23 ENCOUNTER — Emergency Department
Admission: EM | Admit: 2017-03-23 | Discharge: 2017-03-23 | Disposition: A | Discharge status: Home / Self Care | Payer: Medicaid Other | Attending: Student in an Organized Health Care Education/Training Program | Admitting: Student in an Organized Health Care Education/Training Program

## 2017-03-23 ENCOUNTER — Encounter: Payer: Self-pay | Admitting: Emergency Medicine

## 2017-03-23 DIAGNOSIS — I10 Essential (primary) hypertension: Secondary | ICD-10-CM | POA: Insufficient documentation

## 2017-03-23 DIAGNOSIS — Y999 Unspecified external cause status: Secondary | ICD-10-CM | POA: Insufficient documentation

## 2017-03-23 DIAGNOSIS — T24212A Burn of second degree of left thigh, initial encounter: Secondary | ICD-10-CM | POA: Diagnosis not present

## 2017-03-23 DIAGNOSIS — O9A211 Injury, poisoning and certain other consequences of external causes complicating pregnancy, first trimester: Secondary | ICD-10-CM | POA: Insufficient documentation

## 2017-03-23 DIAGNOSIS — Z87891 Personal history of nicotine dependence: Secondary | ICD-10-CM | POA: Insufficient documentation

## 2017-03-23 DIAGNOSIS — Y9389 Activity, other specified: Secondary | ICD-10-CM | POA: Diagnosis not present

## 2017-03-23 DIAGNOSIS — Z3A13 13 weeks gestation of pregnancy: Secondary | ICD-10-CM | POA: Diagnosis not present

## 2017-03-23 DIAGNOSIS — Y92511 Restaurant or cafe as the place of occurrence of the external cause: Secondary | ICD-10-CM | POA: Insufficient documentation

## 2017-03-23 DIAGNOSIS — T310 Burns involving less than 10% of body surface: Secondary | ICD-10-CM | POA: Diagnosis not present

## 2017-03-23 DIAGNOSIS — T24211A Burn of second degree of right thigh, initial encounter: Secondary | ICD-10-CM | POA: Diagnosis not present

## 2017-03-23 DIAGNOSIS — T2127XA Burn of second degree of female genital region, initial encounter: Secondary | ICD-10-CM | POA: Insufficient documentation

## 2017-03-23 DIAGNOSIS — X12XXXA Contact with other hot fluids, initial encounter: Secondary | ICD-10-CM | POA: Insufficient documentation

## 2017-03-23 DIAGNOSIS — T300 Burn of unspecified body region, unspecified degree: Secondary | ICD-10-CM

## 2017-03-23 MED ORDER — BACITRACIN-NEOMYCIN-POLYMYXIN OINTMENT TUBE
TOPICAL_OINTMENT | Freq: Every day | CUTANEOUS | Status: DC
  Administered 2017-03-23: 2 via TOPICAL
  Filled 2017-03-23: qty 14.17

## 2017-03-23 MED ORDER — LIDOCAINE-EPINEPHRINE-TETRACAINE (LET) SOLUTION
3.0000 mL | Freq: Once | NASAL | Status: AC
  Administered 2017-03-23: 20:00:00 3 mL via TOPICAL
  Filled 2017-03-23: qty 3

## 2017-03-23 MED ORDER — LIDOCAINE-EPINEPHRINE-TETRACAINE (LET) SOLUTION
3.0000 mL | Freq: Once | NASAL | Status: AC
  Administered 2017-03-23: 3 mL via TOPICAL
  Filled 2017-03-23: qty 3

## 2017-03-23 MED ORDER — BACITRACIN ZINC 500 UNIT/GM EX OINT
TOPICAL_OINTMENT | CUTANEOUS | Status: AC
  Filled 2017-03-23: qty 3.6

## 2017-03-23 MED ORDER — ACETAMINOPHEN 500 MG PO TABS
1000.0000 mg | ORAL_TABLET | Freq: Once | ORAL | Status: AC
  Administered 2017-03-23: 1000 mg via ORAL
  Filled 2017-03-23: qty 2

## 2017-03-23 NOTE — ED Notes (Signed)
Patient's wounds cleaned with NS and pat dried.  Suture cart at bedside with bacitracin since pharmacy has been unreachable

## 2017-03-23 NOTE — ED Notes (Addendum)
Patient's left thigh was debrided by MD and Xeroform applied to all of her wounds, sterile gauze applied, and wrapped in roll gauze.  Patient given blue scrub pants since she is unable to wear hers.
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
## 2017-05-25 ENCOUNTER — Observation Stay: Payer: Medicaid Other

## 2017-05-25 ENCOUNTER — Observation Stay
Admission: EM | Admit: 2017-05-25 | Discharge: 2017-05-25 | Disposition: A | Discharge status: Home / Self Care | Payer: Medicaid Other | Attending: Obstetrics and Gynecology | Admitting: Obstetrics and Gynecology

## 2017-05-25 DIAGNOSIS — O9989 Other specified diseases and conditions complicating pregnancy, childbirth and the puerperium: Secondary | ICD-10-CM | POA: Diagnosis present

## 2017-05-25 DIAGNOSIS — Z87891 Personal history of nicotine dependence: Secondary | ICD-10-CM | POA: Insufficient documentation

## 2017-05-25 DIAGNOSIS — O3412 Maternal care for benign tumor of corpus uteri, second trimester: Secondary | ICD-10-CM | POA: Diagnosis not present

## 2017-05-25 DIAGNOSIS — Z3A23 23 weeks gestation of pregnancy: Secondary | ICD-10-CM | POA: Insufficient documentation

## 2017-05-25 DIAGNOSIS — O321XX0 Maternal care for breech presentation, not applicable or unspecified: Secondary | ICD-10-CM | POA: Insufficient documentation

## 2017-05-25 DIAGNOSIS — R112 Nausea with vomiting, unspecified: Secondary | ICD-10-CM | POA: Diagnosis not present

## 2017-05-25 DIAGNOSIS — Z833 Family history of diabetes mellitus: Secondary | ICD-10-CM | POA: Diagnosis not present

## 2017-05-25 DIAGNOSIS — Z6836 Body mass index (BMI) 36.0-36.9, adult: Secondary | ICD-10-CM | POA: Insufficient documentation

## 2017-05-25 DIAGNOSIS — O99212 Obesity complicating pregnancy, second trimester: Secondary | ICD-10-CM | POA: Insufficient documentation

## 2017-05-25 DIAGNOSIS — E669 Obesity, unspecified: Secondary | ICD-10-CM | POA: Diagnosis not present

## 2017-05-25 DIAGNOSIS — O09292 Supervision of pregnancy with other poor reproductive or obstetric history, second trimester: Secondary | ICD-10-CM | POA: Insufficient documentation

## 2017-05-25 DIAGNOSIS — R197 Diarrhea, unspecified: Secondary | ICD-10-CM | POA: Diagnosis not present

## 2017-05-25 DIAGNOSIS — D259 Leiomyoma of uterus, unspecified: Secondary | ICD-10-CM | POA: Insufficient documentation

## 2017-05-25 DIAGNOSIS — Z825 Family history of asthma and other chronic lower respiratory diseases: Secondary | ICD-10-CM | POA: Diagnosis not present

## 2017-05-25 DIAGNOSIS — Z3A22 22 weeks gestation of pregnancy: Secondary | ICD-10-CM

## 2017-05-25 MED ORDER — LOPERAMIDE HCL 2 MG PO CAPS
ORAL_CAPSULE | ORAL | Status: AC
  Administered 2017-05-25: 2 mg via ORAL
  Filled 2017-05-25: qty 1

## 2017-05-25 MED ORDER — ONDANSETRON 4 MG PO TBDP
ORAL_TABLET | ORAL | Status: AC
  Administered 2017-05-25: 4 mg via ORAL
  Filled 2017-05-25: qty 1

## 2017-05-25 MED ORDER — LOPERAMIDE HCL 2 MG PO CAPS
2.0000 mg | ORAL_CAPSULE | ORAL | Status: DC | PRN
  Administered 2017-05-25: 2 mg via ORAL

## 2017-05-25 MED ORDER — ONDANSETRON 4 MG PO TBDP
4.0000 mg | ORAL_TABLET | Freq: Once | ORAL | Status: AC
  Administered 2017-05-25: 4 mg via ORAL
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
## 2017-05-25 NOTE — Discharge Instructions (Signed)
Preventing Preterm Birth °Preterm birth is when your baby is delivered between 20 weeks and 37 weeks of pregnancy. A full-term pregnancy lasts for at least 37 weeks. Preterm birth can be dangerous for your baby because the last few weeks of pregnancy are an important time for your baby's brain and lungs to grow. Many things can cause a baby to be born early. Sometimes the cause is not known. There are certain factors that make you more likely to experience preterm birth, such as: °· Having a previous baby born preterm. °· Being pregnant with twins or other multiples. °· Having had fertility treatment. °· Being overweight or underweight at the start of your pregnancy. °· Having any of the following during pregnancy: °¨ An infection, including a urinary tract infection (UTI) or an STI (sexually transmitted infection). °¨ High blood pressure. °¨ Diabetes. °¨ Vaginal bleeding. °· Being age 35 or older. °· Being age 18 or younger. °· Getting pregnant within 6 months of a previous pregnancy. °· Suffering extreme stress or physical or emotional abuse during pregnancy. °· Standing for long periods of time during pregnancy, such as working at a job that requires standing. °What are the risks? °The most serious risk of preterm birth is that the baby may not survive. This is more likely to happen if a baby is born before 34 weeks. Other risks and complications of preterm birth may include your baby having: °· Breathing problems. °· Brain damage that affects movement and coordination (cerebral palsy). °· Feeding difficulties. °· Vision or hearing problems. °· Infections or inflammation of the digestive tract (colitis). °· Developmental delays. °· Learning disabilities. °· Higher risk for diabetes, heart disease, and high blood pressure later in life. °What can I do to lower my risk? °Medical care °The most important thing you can do to lower your risk for preterm birth is to get routine medical care during pregnancy (prenatal  care). If you have a high risk of preterm birth, you may be referred to a health care provider who specializes in managing high-risk pregnancies (perinatologist). You may be given medicine to help prevent preterm birth. °Lifestyle changes °Certain lifestyle changes can also lower your risk of preterm birth: °· Wait at least 6 months after a pregnancy to become pregnant again. °· Try to plan pregnancy for when you are between 19 and 35 years old. °· Get to a healthy weight before getting pregnant. If you are overweight, work with your health care provider to safely lose weight. °· Do not use any products that contain nicotine or tobacco, such as cigarettes and e-cigarettes. If you need help quitting, ask your health care provider. °· Do not drink alcohol. °· Do not use drugs. °Where to find support: °For more support, consider: °· Talking with your health care provider. °· Talking with a therapist or substance abuse counselor, if you need help quitting. °· Working with a diet and nutrition specialist (dietitian) or a personal trainer to maintain a healthy weight. °· Joining a support group. °Where to find more information: °Learn more about preventing preterm birth from: °· Centers for Disease Control and Prevention: cdc.gov/reproductivehealth/maternalinfanthealth/pretermbirth.htm °· March of Dimes: marchofdimes.org/complications/premature-babies.aspx °· American Pregnancy Association: americanpregnancy.org/labor-and-birth/premature-labor °Contact a health care provider if: °· You have any of the following signs of preterm labor before 37 weeks: °¨ A change or increase in vaginal discharge. °¨ Fluid leaking from your vagina. °¨ Pressure or cramps in your lower abdomen. °¨ A backache that does not go away or gets worse. °¨   Regular tightening (contractions) in your lower abdomen. °Summary °· Preterm birth means having your baby during weeks 20-37 of pregnancy. °· Preterm birth may put your baby at risk for physical and  mental problems. °· Getting good prenatal care can help prevent preterm birth. °· You can lower your risk of preterm birth by making certain lifestyle changes, such as not smoking and not using alcohol. °This information is not intended to replace advice given to you by your health care provider. Make sure you discuss any questions you have with your health care provider. °Document Released: 01/27/2016 Document Revised: 08/21/2016 Document Reviewed: 08/21/2016 °Elsevier Interactive Patient Education © 2017 Elsevier Inc. ° °
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
## 2017-12-21 ENCOUNTER — Encounter (HOSPITAL_COMMUNITY): Payer: Self-pay

## 2019-05-19 IMAGING — US US OB LIMITED
1 series · 14 of 28 positions shown · non-contrast
Comparison: none

CLINICAL DATA: Twenty-two weeks pregnant. Previous intrauterine
fetal demise. Fibroids. Assess cervix.

EXAM:
LIMITED OBSTETRIC ULTRASOUND

[Series 1: us ob limited · 0.23mm/px · 14 of 45 slices shown]
[im 2/45]
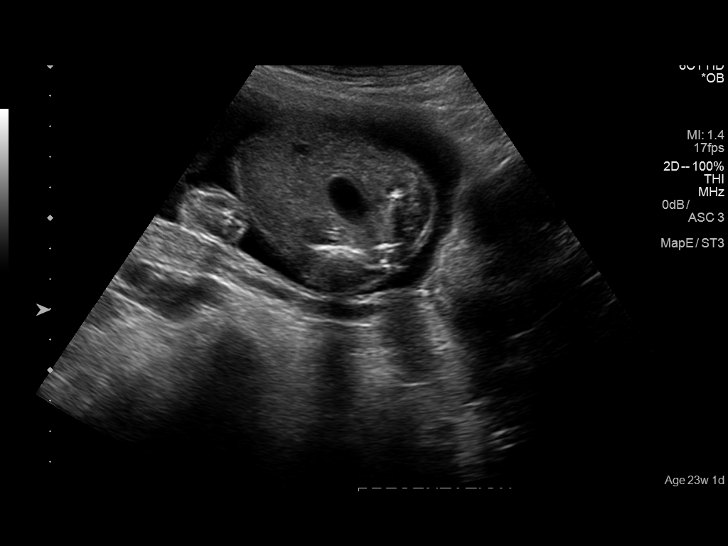
[im 5/45]
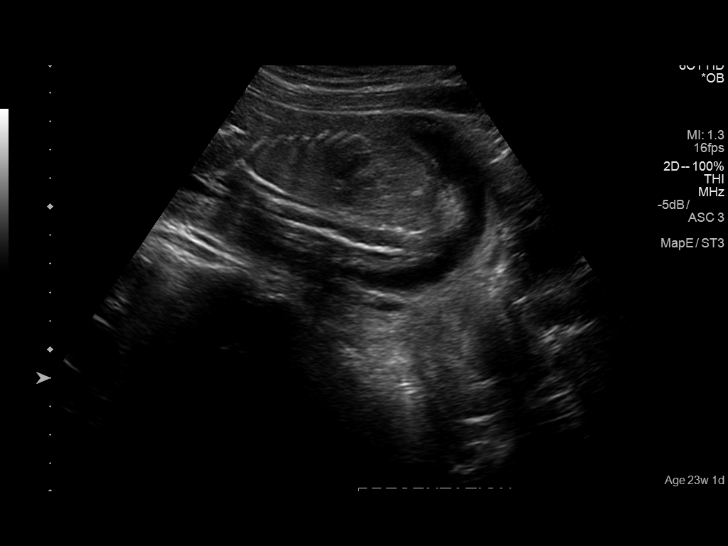
[im 9/45]
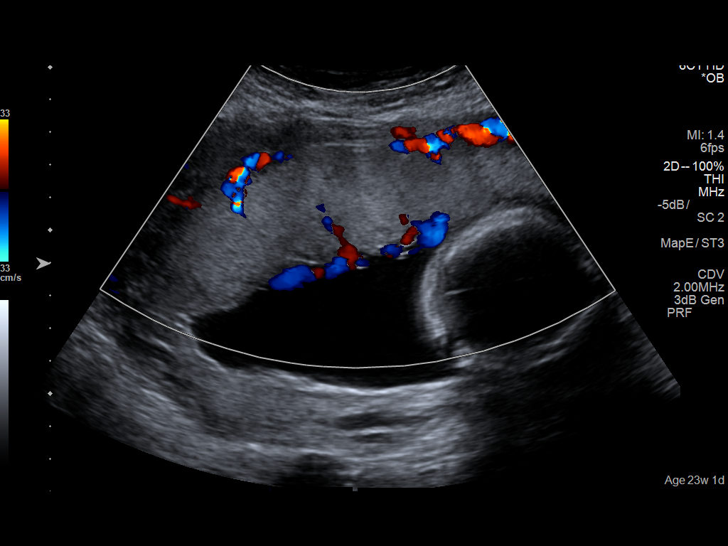
[im 12/45]
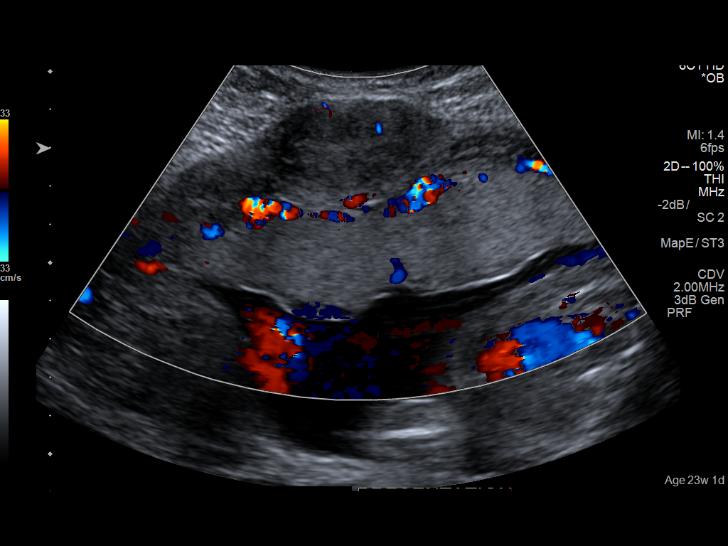
[im 15/45]
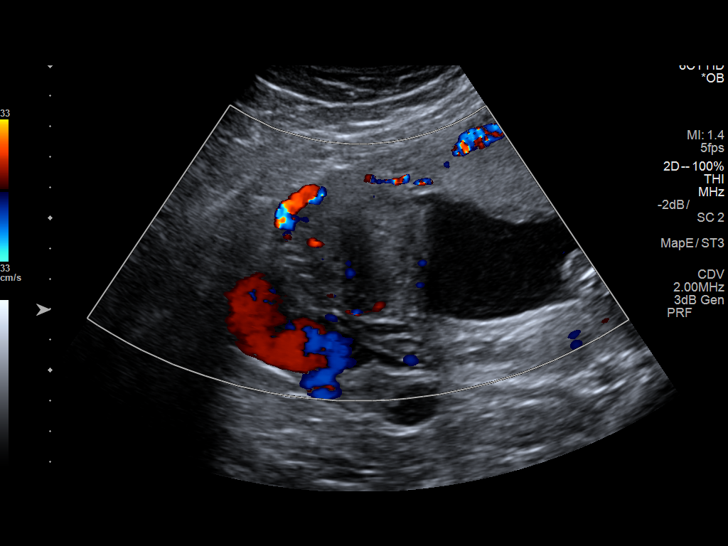
[im 18/45]
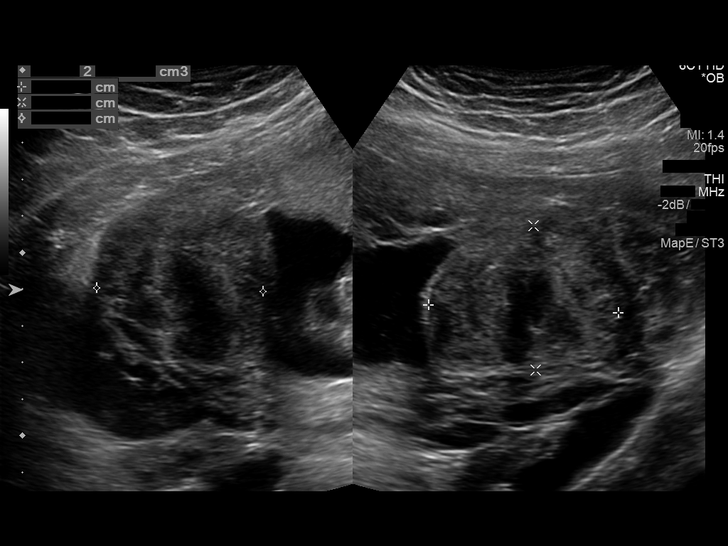
[im 22/45]
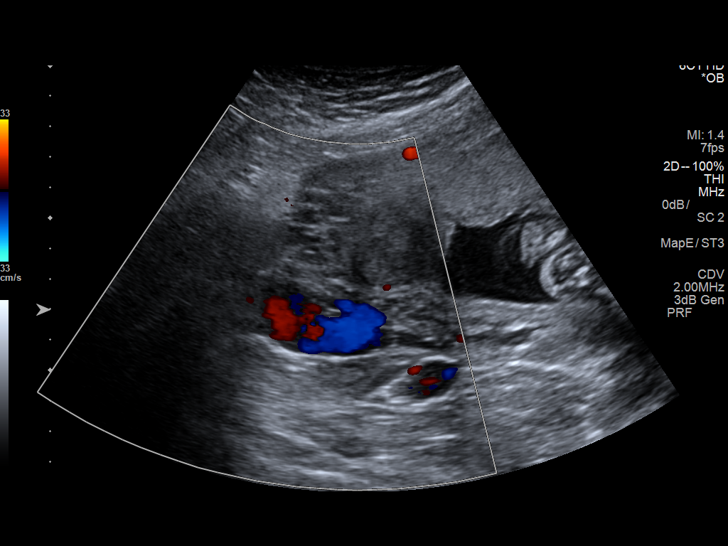
[im 25/45]
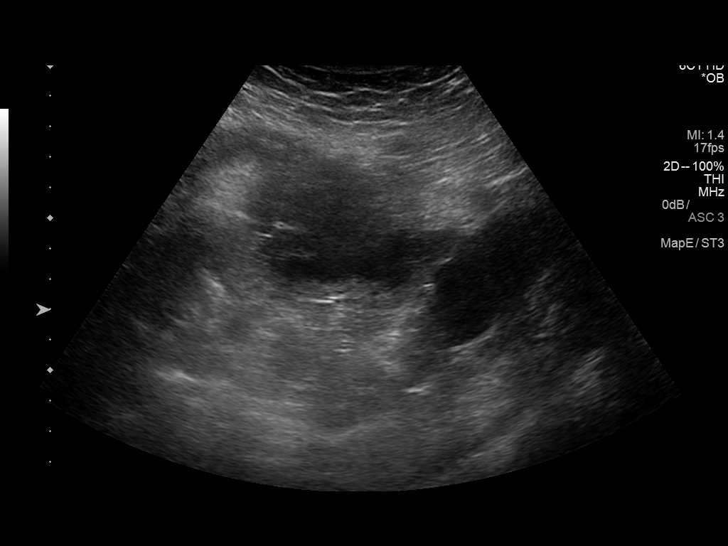
[im 28/45]
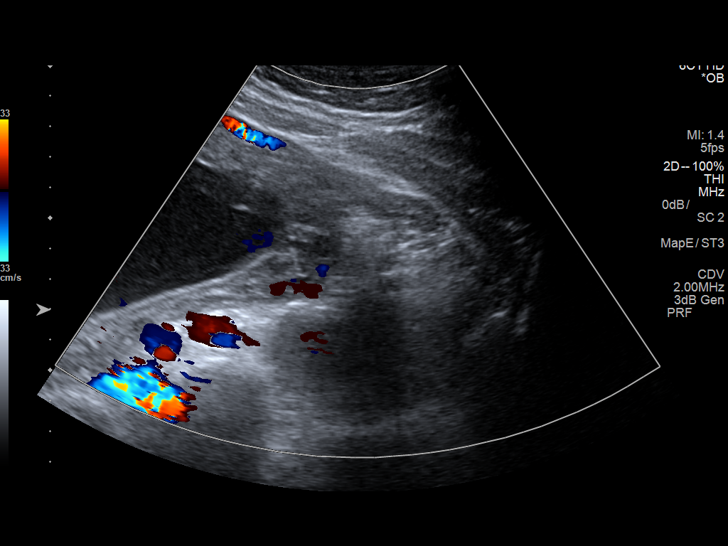
[im 31/45]
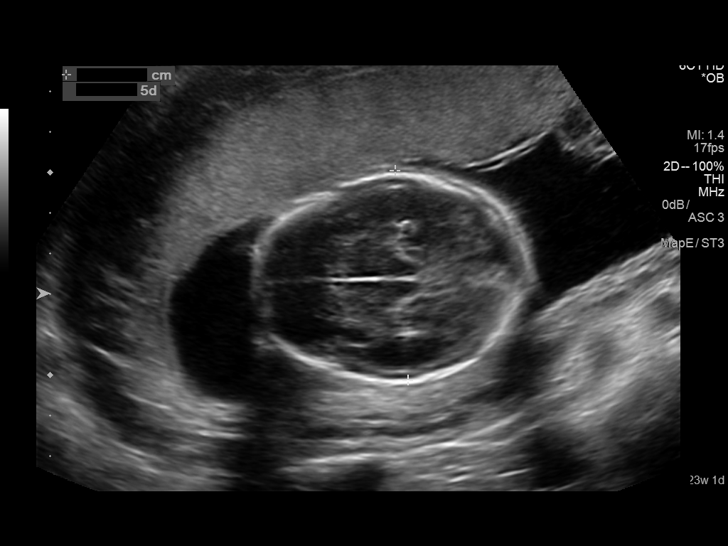
[im 35/45]
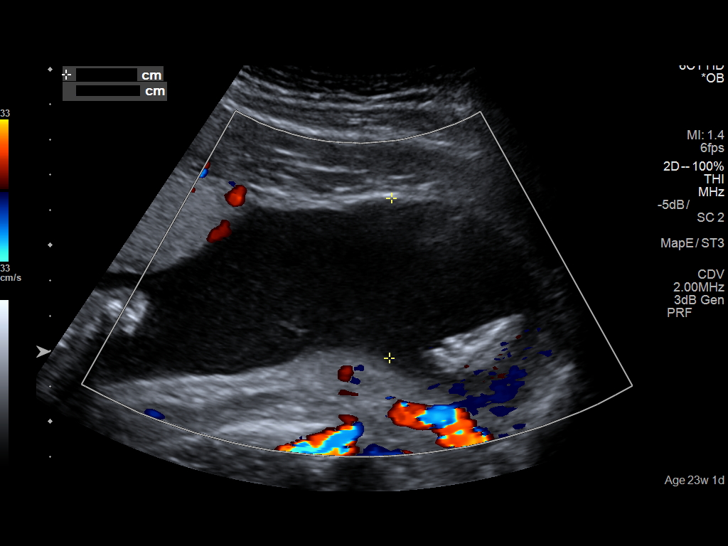
[im 38/45]
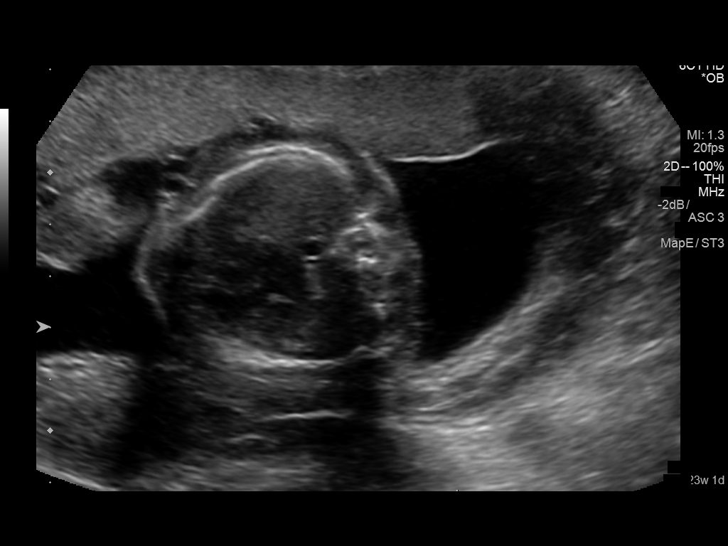
[im 41/45]
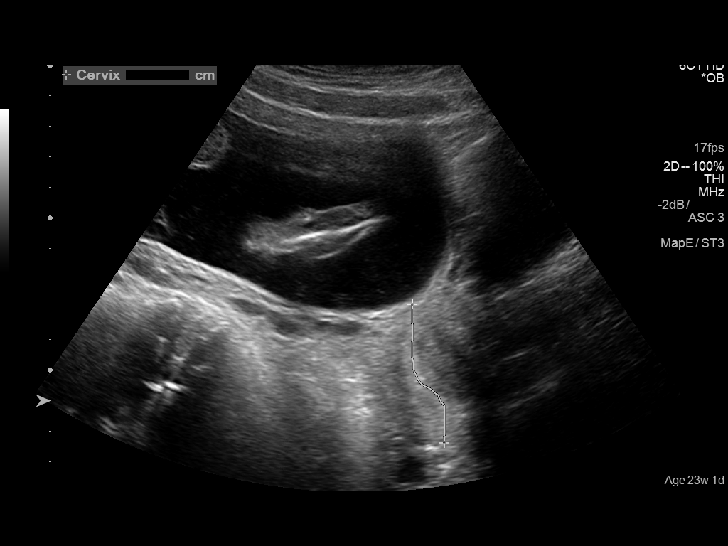
[im 45/45]
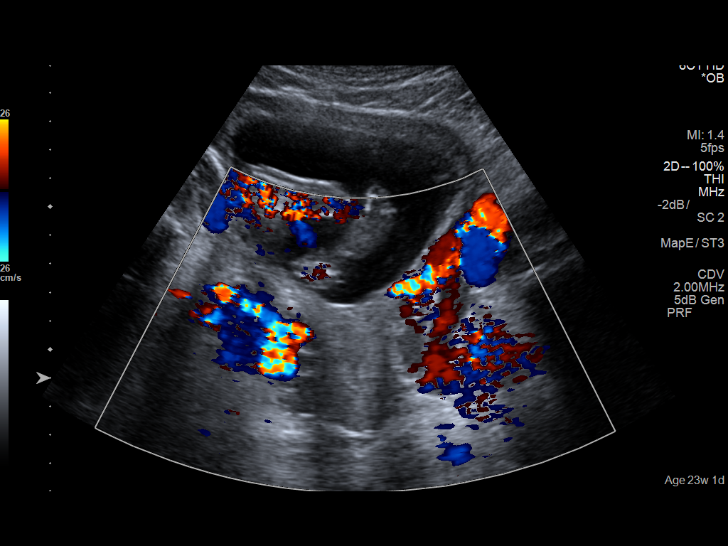

[14 of 28 positions shown; findings below may reference images not displayed]

FINDINGS: Number of Fetuses: 1

Heart Rate:  152 bpm

Movement: Yes

Presentation: Breech

Placental Location: Anterior

Previa: No

Amniotic Fluid (Subjective):  Within normal limits.

AFI:  16.4 cm

BPD:  5.3cm 22w  0d

MATERNAL FINDINGS:

Cervix: Measures 4.5 cm in length transabdominally. Appears closed.
No funneling seen.

Uterus/Adnexae: At least 3 uterine fibroids are seen, which are
located in the anterior fundus measuring 5.0 cm, the right lateral
corpus measuring 5.2 cm, and the posterior corpus measuring 3.8 cm.
IMPRESSION: Single living intrauterine fetus in breech presentation.

Normal cervical length.  No funneling seen.  No evidence of previa.

At least 3 uterine fibroids, largest measuring 5.2 cm.

This exam is performed on an emergent basis and does not
comprehensively evaluate fetal size, dating, or anatomy; follow-up
complete OB US should be considered if further fetal assessment is
warranted.

## 2019-09-23 ENCOUNTER — Encounter (HOSPITAL_COMMUNITY): Payer: Self-pay | Admitting: Emergency Medicine

## 2019-09-23 ENCOUNTER — Emergency Department (HOSPITAL_COMMUNITY)
Admission: EM | Admit: 2019-09-23 | Discharge: 2019-09-23 | Disposition: A | Discharge status: Home / Self Care | Payer: Self-pay | Attending: Emergency Medicine | Admitting: Emergency Medicine

## 2019-09-23 ENCOUNTER — Other Ambulatory Visit: Payer: Self-pay

## 2019-09-23 DIAGNOSIS — W009XXA Unspecified fall due to ice and snow, initial encounter: Secondary | ICD-10-CM | POA: Insufficient documentation

## 2019-09-23 DIAGNOSIS — Y939 Activity, unspecified: Secondary | ICD-10-CM | POA: Insufficient documentation

## 2019-09-23 DIAGNOSIS — Z5321 Procedure and treatment not carried out due to patient leaving prior to being seen by health care provider: Secondary | ICD-10-CM | POA: Insufficient documentation

## 2019-09-23 DIAGNOSIS — Y999 Unspecified external cause status: Secondary | ICD-10-CM | POA: Insufficient documentation

## 2019-09-23 DIAGNOSIS — S01112A Laceration without foreign body of left eyelid and periocular area, initial encounter: Secondary | ICD-10-CM | POA: Insufficient documentation

## 2019-09-23 DIAGNOSIS — Y92099 Unspecified place in other non-institutional residence as the place of occurrence of the external cause: Secondary | ICD-10-CM | POA: Insufficient documentation

## 2019-09-23 NOTE — ED Notes (Signed)
No answer for vitals recheck x1 and pt visibly not in waiting area.

## 2019-09-23 NOTE — ED Notes (Signed)
Pt approached this NT and was upset at the wait time at this time. This NT advised pt of leaving AMA. Pt aware of concern as NT explained the risk of face laceration. Pt still wanted to leave.
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
